# Patient Record
Sex: Male | Born: 1945 | Race: White | Hispanic: No | Marital: Married | Smoking: Former smoker
Health system: Southern US, Community
[De-identification: ages and names within clinical notes are randomized; demographics above are authoritative.]

## PROBLEM LIST (undated history)

## (undated) DIAGNOSIS — G43109 Migraine with aura, not intractable, without status migrainosus: Secondary | ICD-10-CM

## (undated) DIAGNOSIS — K219 Gastro-esophageal reflux disease without esophagitis: Secondary | ICD-10-CM

## (undated) DIAGNOSIS — J302 Other seasonal allergic rhinitis: Secondary | ICD-10-CM

## (undated) DIAGNOSIS — E785 Hyperlipidemia, unspecified: Secondary | ICD-10-CM

## (undated) DIAGNOSIS — C61 Malignant neoplasm of prostate: Secondary | ICD-10-CM

## (undated) DIAGNOSIS — K829 Disease of gallbladder, unspecified: Secondary | ICD-10-CM

## (undated) DIAGNOSIS — I2510 Atherosclerotic heart disease of native coronary artery without angina pectoris: Secondary | ICD-10-CM

## (undated) HISTORY — DX: Migraine with aura, not intractable, without status migrainosus: G43.109

## (undated) HISTORY — DX: Disease of gallbladder, unspecified: K82.9

## (undated) HISTORY — DX: Malignant neoplasm of prostate: C61

## (undated) HISTORY — DX: Atherosclerotic heart disease of native coronary artery without angina pectoris: I25.10

## (undated) HISTORY — DX: Hyperlipidemia, unspecified: E78.5

## (undated) HISTORY — DX: Gastro-esophageal reflux disease without esophagitis: K21.9

## (undated) HISTORY — DX: Other seasonal allergic rhinitis: J30.2

## (undated) HISTORY — PX: CATARACT EXTRACTION, BILATERAL: SHX1313

---

## 2005-08-02 ENCOUNTER — Ambulatory Visit: Payer: Self-pay | Admitting: Gastroenterology

## 2005-08-16 ENCOUNTER — Encounter (INDEPENDENT_AMBULATORY_CARE_PROVIDER_SITE_OTHER): Payer: Self-pay | Admitting: Specialist

## 2005-08-16 ENCOUNTER — Ambulatory Visit: Payer: Self-pay | Admitting: Internal Medicine

## 2006-07-02 HISTORY — PX: LAPAROSCOPIC CHOLECYSTECTOMY: SUR755

## 2006-07-08 HISTORY — PX: CARDIOVASCULAR STRESS TEST: SHX262

## 2006-07-16 ENCOUNTER — Ambulatory Visit (HOSPITAL_COMMUNITY): Admission: RE | Admit: 2006-07-16 | Discharge: 2006-07-16 | Payer: Self-pay | Admitting: *Deleted

## 2006-07-16 HISTORY — PX: CARDIAC CATHETERIZATION: SHX172

## 2007-07-03 HISTORY — PX: PROSTATE BIOPSY: SHX241

## 2008-03-15 ENCOUNTER — Inpatient Hospital Stay (HOSPITAL_COMMUNITY): Admission: AD | Admit: 2008-03-15 | Discharge: 2008-03-16 | Payer: Self-pay | Admitting: General Surgery

## 2008-03-15 ENCOUNTER — Encounter (INDEPENDENT_AMBULATORY_CARE_PROVIDER_SITE_OTHER): Payer: Self-pay | Admitting: General Surgery

## 2008-08-18 ENCOUNTER — Encounter (INDEPENDENT_AMBULATORY_CARE_PROVIDER_SITE_OTHER): Payer: Self-pay | Admitting: *Deleted

## 2008-08-30 ENCOUNTER — Ambulatory Visit: Payer: Self-pay | Admitting: Internal Medicine

## 2008-09-14 ENCOUNTER — Ambulatory Visit: Payer: Self-pay | Admitting: Internal Medicine

## 2008-09-14 ENCOUNTER — Encounter: Payer: Self-pay | Admitting: Internal Medicine

## 2008-09-14 HISTORY — PX: COLONOSCOPY: SHX174

## 2008-09-17 ENCOUNTER — Encounter: Payer: Self-pay | Admitting: Internal Medicine

## 2008-09-20 LAB — HM COLONOSCOPY

## 2010-11-14 NOTE — Op Note (Signed)
NAMEMARVENS, HOLLARS               ACCOUNT NO.:  [id number]   MEDICAL RECORD NO.:  [id number]          PATIENT TYPE:  AMB   LOCATION:  DAY                          FACILITY:  [facility]   PHYSICIAN:  Juanetta Gosling, MDDATE OF BIRTH:  [birth year]   DATE OF PROCEDURE:  03/15/2008  DATE OF DISCHARGE:                               OPERATIVE REPORT   PREOPERATIVE DIAGNOSES:  Symptomatic cholelithiasis, hydropic  gallbladder.   POSTOPERATIVE DIAGNOSES:  Symptomatic cholelithiasis, hydropic  gallbladder, chronic cholecystitis.   PROCEDURE:  Laparoscopic cholecystectomy.   SURGEON:  Juanetta Gosling, MD   ASSISTANT:  Claud Kelp and Dominga Ferry.   ANESTHESIA:  General.   FINDINGS:  Hydropic gallbladder that was adherent to the liver and signs  of chronic inflammation.   SPECIMEN:  Gallbladder and contents to pathology.   ESTIMATED BLOOD LOSS:  100 mL.   COMPLICATIONS:  None.   DRAINS:  None.   INDICATIONS:  Mr. Leverich is a 65 year old male who was seen after  feeling a possible right upper quadrant mass that he reported causing no  pain.  He does have some indigestion, he has been evaluated by [facility] for stress test with angina.  He was sent for a CT scan by  his primary care physician, noted the gallbladder being marked with  multiple gallstones but no biliary obstruction noted.  I sent him for an  ERCP as well which had excellent vision of the ductal system with no  evidence of pancreatic or liver mass, just with a very distended  gallbladder and gallstones.  I counseled him for a laparoscopic  cholecystectomy for what I think is likely a symptomatic cholelithiasis  and not his cardiac disease that he had originally been evaluated for.   PROCEDURE:  After informed consent was obtained, the patient was  administered cefoxitin.  He was then taken to the operating room where  he was placed under general anesthesia without complication.  Sequential  compression devices were placed on his lower extremity throughout the  operation.  His abdomen was then prepped and draped in standard sterile  surgical fashion and the surgical time-out was then performed.  A 10-mm  incision was then made below his belly button. Dissection was carried  out down to level of fascia which was entered sharply.  Peritoneum was  then entered bluntly. Vicryl pursestring suture was then placed on the  fascia.  Hassan trocar was then inserted and abdomen was insufflated to  15 mmHg pressure.  The camera was then inserted.  There was no injury  from entry a further 5-mm epigastric port was placed as well as two  further right upper quadrant ports.  They were all 5 mm in size after  infiltration with local anesthesia without complication.  The  gallbladder was noted to be very distended and nearly reached the  abdominal wall upon insufflating the abdomen.  There were many adhesions  to the surrounding structures and omentum was draped over the  gallbladder.  This was begun to be removed.  The needle was then  inserted and the gallbladder was  drained.  There was a large amount of  white bile that was removed from the gallbladder and it was made flaccid  and able to grasp at this point.  The adhesions were then taken down  from the gallbladder until we were approaching the infundibulum.  This  was finally retracted laterally and the rest the gallbladder was  retracted cephalad and dissection was carried out down the triangle of  Calot.  The cystic duct and cystic artery were both identified clearly  entering the gallbladder and the critical view of safety was obtained.  Once this view had been obtained, the cystic duct was clipped leaving  two clips in position, then cut and the cystic artery was then clipped  leaving two clips in position and cut as well.  Electrocautery was then  used to remove the gallbladder from the liver bed.  There were numerous  places where  there was no plane, the gallbladder was fused to the liver  bed.  A number of areas were bleeding during this portion of the  operation.  These were cauterized with the Bovie at 60 with cessation of  hemorrhage.  Gallbladder with some difficulty was eventually removed  from the liver bed.  This area was then inspected.  Suction was then  performed.  Hemostasis was eventually observed.  I did place a piece of  Surgicel in the liver bed at that point as well.  The gallbladder was  then placed in an EndoCatch bag.  A 5-mm camera was inserted to the  epigastrium.  The gallbladder was then removed from the umbilical port.  The Hasson trocar was then reinserted.  A 10-mm camera was inserted.  The liver bed was then viewed again and noted to be hemostatic.  Suction  was used to remove all the excess fluid that had there present and all  trocars removed under direct vision.  Hassan trocar was then removed.  The Vicryl was tied down.  There was still a small hernia defect there.  This was closed with an additional Vicryl suture.  All the skin sites  were closed with a 4-0 Monocryl in subcuticular fashion.  Dermabond was  placed over these.  He tolerated the procedure well and was extubated in  the operating room and transferred to recovery in stable position.      Juanetta Gosling, MD  Electronically Signed     MCW/MEDQ  D:  03/15/2008  T:  03/16/2008  Job:  [id number]

## 2010-11-17 NOTE — Cardiovascular Report (Signed)
NAMESHOURYA, MACPHERSON               ACCOUNT NO.:  [id number]   MEDICAL RECORD NO.:  [id number]          PATIENT TYPE:  OIB   LOCATION:  2899                         FACILITY:  [facility]   PHYSICIAN:  Elmore Guise., M.D.DATE OF BIRTH:  April 22, 1946   DATE OF PROCEDURE:  07/16/2006  DATE OF DISCHARGE:  07/16/2006                            CARDIAC CATHETERIZATION   INDICATIONS FOR PROCEDURE:  Chest pain with abnormal stress test.   HISTORY OF PRESENT ILLNESS:  Mr. Masoud is a very pleasant 65 year old  white male, past medical history of dyslipidemia, borderline  hypertension, who initially presented for evaluation of an abnormal EKG.  He underwent exercise stress testing which showed a small reversible  inferolateral defect.  He now presents for a cardiac catheterization.   PROCEDURE:  The patient was brought to the cardiac cath lab after  appropriate informed consent.  He was prepped and draped in sterile  fashion.  Approximately 15 mL of 1% lidocaine was used for local  anesthesia.  A 6-French sheath was placed in the right femoral artery  without difficulty.  Coronary angiography, LV angiography and limited  right femoral angiography were then performed.  Angio-Seal closure  device was deployed without difficulty.   FINDINGS:  1. Left main, no obstructive disease.  2. LAD:  Moderate-to-large vessel with mild luminal irregularities.  3. D1:  Moderate-sized vessel with mild luminal regularities.  4. D2:  Small vessel with mild luminal irregularities.  5. LCX:  Nondominant with mild luminal irregularities.  6. OM-1:  Small vessel with mild to moderate luminal irregularities.  7. OM-2:  Moderate-to-large vessel with two distal branches, all with      mild luminal irregularities.  8. RCA:  Dominant large vessel with mild luminal irregularities.  9. PDA/PLV:  No significant obstructive disease.  10.EF is 50-55%, no wall motion abnormalities.  LVEDP is 15 mmHg.  11.Limited right  femoral angiogram shows no significant obstructive      disease.  Arteriotomy site is satisfactory for Angio-Seal closure      device.  Angio-Seal closure device was deployed without difficulty.   IMPRESSION:  1. Non-obstructive coronary arteries as described.  2. Preserved LV systolic function with an EF of 50-55% with no wall      motion abnormalities.   PLAN:  1. At this time, I would recommend aggressive risk factor modification      as indicated.      Elmore Guise., M.D.  Electronically Signed     TWK/MEDQ  D:  07/16/2006  T:  07/16/2006  Job:  [id number]   cc:   Liston Alba, M.D.

## 2011-01-09 ENCOUNTER — Other Ambulatory Visit: Payer: Self-pay | Admitting: *Deleted

## 2011-01-09 DIAGNOSIS — E785 Hyperlipidemia, unspecified: Secondary | ICD-10-CM

## 2011-01-23 ENCOUNTER — Encounter: Payer: Self-pay | Admitting: Cardiovascular Disease

## 2011-01-23 ENCOUNTER — Other Ambulatory Visit: Payer: Self-pay | Admitting: Cardiovascular Disease

## 2011-01-23 NOTE — Telephone Encounter (Signed)
Fax received from pharmacy. Refill completed. Has app  Alfonso Ramus RN

## 2011-01-25 ENCOUNTER — Other Ambulatory Visit (INDEPENDENT_AMBULATORY_CARE_PROVIDER_SITE_OTHER): Payer: Medicare Other | Admitting: *Deleted

## 2011-01-25 ENCOUNTER — Encounter: Payer: Self-pay | Admitting: Cardiovascular Disease

## 2011-01-25 ENCOUNTER — Ambulatory Visit (INDEPENDENT_AMBULATORY_CARE_PROVIDER_SITE_OTHER): Payer: Medicare Other | Admitting: Cardiovascular Disease

## 2011-01-25 ENCOUNTER — Other Ambulatory Visit: Payer: Self-pay | Admitting: *Deleted

## 2011-01-25 VITALS — BP 130/84 | HR 66 | Ht 71.0 in | Wt 205.0 lb

## 2011-01-25 DIAGNOSIS — E785 Hyperlipidemia, unspecified: Secondary | ICD-10-CM | POA: Insufficient documentation

## 2011-01-25 DIAGNOSIS — I2510 Atherosclerotic heart disease of native coronary artery without angina pectoris: Secondary | ICD-10-CM | POA: Insufficient documentation

## 2011-01-25 DIAGNOSIS — R079 Chest pain, unspecified: Secondary | ICD-10-CM

## 2011-01-25 LAB — LIPID PANEL
LDL Cholesterol: 84 mg/dL (ref 0–99)
VLDL: 22.6 mg/dL (ref 0.0–40.0)

## 2011-01-25 LAB — HEPATIC FUNCTION PANEL
AST: 15 U/L (ref 0–37)
Alkaline Phosphatase: 76 U/L (ref 39–117)
Total Bilirubin: 0.6 mg/dL (ref 0.3–1.2)

## 2011-01-25 LAB — BASIC METABOLIC PANEL
GFR: 84.46 mL/min (ref >60.00)
Glucose, Bld: 98 mg/dL (ref 70–99)
Potassium: 4.5 mEq/L (ref 3.5–5.1)
Sodium: 139 mEq/L (ref 135–145)

## 2011-01-25 MED ORDER — ROSUVASTATIN CALCIUM 20 MG PO TABS: 20.0000 mg | ORAL_TABLET | Freq: Every day | ORAL | Status: DC

## 2011-01-25 NOTE — Assessment & Plan Note (Signed)
No further episodes of chest pain. 

## 2011-01-25 NOTE — Telephone Encounter (Signed)
Had med refill error and i reordered under correct [facility] mail order

## 2011-01-25 NOTE — Assessment & Plan Note (Signed)
We will continue with his same medications. Recheck lipids again in one year when I see him for his office visit.

## 2011-01-25 NOTE — Progress Notes (Signed)
Bernard Reed Date of Birth  08-26-1945 [facility] [address]     [address] [place], [place]  [address] [phone]  Fax  [phone]  History of Present Illness:  Hx of hyperlipidemia.  Non obstructive CAD by cath.1/08.  Occasional episodes of CP.  His overall done very well since he was last seen in July of last year. He has not had any episodes of chest pain or shortness of breath.  Current Outpatient Prescriptions on File Prior to Visit  Medication Sig Dispense Refill  . aspirin 81 MG tablet Take 81 mg by mouth daily.        . CRESTOR 20 MG tablet TAKE 1 TABLET BY MOUTH AT BEDTIME  90 tablet  0  . finasteride (PROSCAR) 5 MG tablet Take 5 mg by mouth daily.        . nitroGLYCERIN (NITROSTAT) 0.4 MG SL tablet Place 0.4 mg under the tongue every 5 (five) minutes as needed.          No Known Allergies  Past Medical History  Diagnosis Date  . Hyperlipidemia   . Prostate cancer   . Coronary artery disease     NONOBSTRUCTIVE  . GERD (gastroesophageal reflux disease)   . Gallbladder disease     Past Surgical History  Procedure Date  . Cardiac catheterization 07/16/2006    EF 50-55%  . Cardiovascular stress test 07/08/2006    EF 57%    History  Smoking status  . Former Smoker  . Quit date: 01/22/1981  Smokeless tobacco  . Not on file    History  Alcohol Use No    Family History  Problem Relation Age of Onset  . Heart disease Mother   . Heart disease Father   . Hypertension Father   . Heart attack Father     Reviw of Systems:  Reviewed in the HPI.  All other systems are negative.  Physical Exam: BP 130/84  Pulse 66  Ht 5\' 11"  (1.803 m)  Wt 205 lb (92.987 kg)  BMI 28.59 kg/m2 The patient is alert and oriented x 3.  The mood and affect are normal.   Skin: warm and dry.  Color is normal.    HEENT:   the sclera are nonicteric.  The mucous membranes are moist.  The carotids are 2+ without bruits.  There is no  thyromegaly.  There is no JVD.    Lungs: clear.  The chest wall is non tender.    Heart: regular rate with a normal S1 and S2.  There are no murmurs, gallops, or rubs. The PMI is not displaced.     Abdomen: good bowel sounds.  There is no guarding or rebound.  There is no hepatosplenomegaly or tenderness.  There are no masses.   Extremities:  no clubbing, cyanosis, or edema.  The legs are without rashes.  The distal pulses are intact.   Neuro:  Cranial nerves II - XII are intact.  Motor and sensory functions are intact.    The gait is normal.  ECG: Normal sinus rhythm. EKG is normal.  Assessment / Plan:

## 2011-01-26 ENCOUNTER — Telehealth: Payer: Self-pay | Admitting: *Deleted

## 2011-01-26 ENCOUNTER — Encounter: Payer: Self-pay | Admitting: Cardiovascular Disease

## 2011-01-26 NOTE — Telephone Encounter (Signed)
Patient called with lab results. Pt verbalized understanding. Jodette Cyann Venti RN  

## 2011-04-02 LAB — COMPREHENSIVE METABOLIC PANEL
Alkaline Phosphatase: 73
BUN: 7
Chloride: 102
Creatinine, Ser: 0.94
Glucose, Bld: 113 — ABNORMAL HIGH
Potassium: 4.3
Total Bilirubin: 1
Total Protein: 5.7 — ABNORMAL LOW

## 2011-04-02 LAB — CBC
HCT: 36.1 — ABNORMAL LOW
Hemoglobin: 12.4 — ABNORMAL LOW
Hemoglobin: 12.8 — ABNORMAL LOW
MCV: 86.9
Platelets: 255
RBC: 4.33
RDW: 14.6
WBC: 7.4

## 2011-04-04 LAB — COMPREHENSIVE METABOLIC PANEL
AST: 17
BUN: 11
CO2: 29
Chloride: 108
Creatinine, Ser: 0.86
GFR calc non Af Amer: >60
Glucose, Bld: 94
Total Bilirubin: 0.7

## 2011-04-04 LAB — DIFFERENTIAL
Basophils Absolute: 0
Eosinophils Relative: 3
Lymphocytes Relative: 29
Neutro Abs: 2.9
Neutrophils Relative %: 59

## 2011-04-04 LAB — CBC
HCT: 40
HCT: 41.7
MCHC: 33.9
MCV: 87.3
Platelets: 279
RBC: 4.58
RBC: 4.72
WBC: 4.9
WBC: 8.6

## 2011-07-05 ENCOUNTER — Other Ambulatory Visit: Payer: Self-pay | Admitting: Dermatology

## 2011-08-01 ENCOUNTER — Encounter: Payer: Self-pay | Admitting: Internal Medicine

## 2011-08-02 ENCOUNTER — Encounter: Payer: Self-pay | Admitting: Internal Medicine

## 2011-08-17 ENCOUNTER — Encounter: Payer: Self-pay | Admitting: Internal Medicine

## 2011-09-26 ENCOUNTER — Ambulatory Visit (AMBULATORY_SURGERY_CENTER): Payer: Medicare Other | Admitting: *Deleted

## 2011-09-26 ENCOUNTER — Encounter: Payer: Self-pay | Admitting: Internal Medicine

## 2011-09-26 VITALS — Ht 71.0 in | Wt 209.8 lb

## 2011-09-26 DIAGNOSIS — Z860100 Personal history of colon polyps, unspecified: Secondary | ICD-10-CM

## 2011-09-26 DIAGNOSIS — Z1211 Encounter for screening for malignant neoplasm of colon: Secondary | ICD-10-CM

## 2011-09-26 MED ORDER — PEG-KCL-NACL-NASULF-NA ASC-C 100 G PO SOLR: ORAL | Status: DC

## 2011-10-10 ENCOUNTER — Encounter: Payer: Self-pay | Admitting: Internal Medicine

## 2011-10-10 ENCOUNTER — Ambulatory Visit (AMBULATORY_SURGERY_CENTER): Payer: Medicare Other | Admitting: Internal Medicine

## 2011-10-10 VITALS — BP 131/75 | HR 77 | Temp 96.2°F | Resp 15 | Ht 71.0 in | Wt 209.0 lb

## 2011-10-10 DIAGNOSIS — Z860100 Personal history of colon polyps, unspecified: Secondary | ICD-10-CM

## 2011-10-10 DIAGNOSIS — D126 Benign neoplasm of colon, unspecified: Secondary | ICD-10-CM

## 2011-10-10 DIAGNOSIS — D129 Benign neoplasm of anus and anal canal: Secondary | ICD-10-CM

## 2011-10-10 DIAGNOSIS — Z8711 Personal history of peptic ulcer disease: Secondary | ICD-10-CM

## 2011-10-10 DIAGNOSIS — Z1211 Encounter for screening for malignant neoplasm of colon: Secondary | ICD-10-CM

## 2011-10-10 DIAGNOSIS — D128 Benign neoplasm of rectum: Secondary | ICD-10-CM

## 2011-10-10 MED ORDER — SODIUM CHLORIDE 0.9 % IV SOLN: 500.0000 mL | INTRAVENOUS | Status: DC

## 2011-10-10 NOTE — Progress Notes (Signed)
Patient did not experience any of the following events: a burn prior to discharge; a fall within the facility; wrong site/side/patient/procedure/implant event; or a hospital transfer or hospital admission upon discharge from the facility. (G8907) Patient did not have preoperative order for IV antibiotic SSI prophylaxis. (G8918)  

## 2011-10-10 NOTE — Progress Notes (Signed)
Pressure applied to abdomen to reach cecum.  

## 2011-10-10 NOTE — Patient Instructions (Signed)
YOU HAD AN ENDOSCOPIC PROCEDURE TODAY AT THE [facility]: Refer to the procedure report that was given to you for any specific questions about what was found during the examination.  If the procedure report does not answer your questions, please call your gastroenterologist to clarify.  If you requested that your care partner not be given the details of your procedure findings, then the procedure report has been included in a sealed envelope for you to review at your convenience later.  YOU SHOULD EXPECT: Some feelings of bloating in the abdomen. Passage of more gas than usual.  Walking can help get rid of the air that was put into your GI tract during the procedure and reduce the bloating. If you had a lower endoscopy (such as a colonoscopy or flexible sigmoidoscopy) you may notice spotting of blood in your stool or on the toilet paper. If you underwent a bowel prep for your procedure, then you may not have a normal bowel movement for a few days.  DIET: Your first meal following the procedure should be a light meal and then it is ok to progress to your normal diet.  A half-sandwich or bowl of soup is an example of a good first meal.  Heavy or fried foods are harder to digest and may make you feel nauseous or bloated.  Likewise meals heavy in dairy and vegetables can cause extra gas to form and this can also increase the bloating.  Drink plenty of fluids but you should avoid alcoholic beverages for 24 hours.  ACTIVITY: Your care partner should take you home directly after the procedure.  You should plan to take it easy, moving slowly for the rest of the day.  You can resume normal activity the day after the procedure however you should NOT DRIVE or use heavy machinery for 24 hours (because of the sedation medicines used during the test).    SYMPTOMS TO REPORT IMMEDIATELY: A gastroenterologist can be reached at any hour.  During normal business hours, 8:30 AM to 5:00 PM Monday through Friday,  call [phone].  After hours and on weekends, please call the GI answering service at [phone] who will take a message and have the physician on call contact you.   Following lower endoscopy (colonoscopy or flexible sigmoidoscopy):  Excessive amounts of blood in the stool  Significant tenderness or worsening of abdominal pains  Swelling of the abdomen that is new, acute  Fever of 100F or higher  Following upper endoscopy (EGD)  Vomiting of blood or coffee ground material  New chest pain or pain under the shoulder blades  Painful or persistently difficult swallowing  New shortness of breath  Fever of 100F or higher  Black, tarry-looking stools  FOLLOW UP: If any biopsies were taken you will be contacted by phone or by letter within the next 1-3 weeks.  Call your gastroenterologist if you have not heard about the biopsies in 3 weeks.  Our staff will call the home number listed on your records the next business day following your procedure to check on you and address any questions or concerns that you may have at that time regarding the information given to you following your procedure. This is a courtesy call and so if there is no answer at the home number and we have not heard from you through the emergency physician on call, we will assume that you have returned to your regular daily activities without incident.  SIGNATURES/CONFIDENTIALITY: You and/or your care   partner have signed paperwork which will be entered into your electronic medical record.  These signatures attest to the fact that that the information above on your After Visit Summary has been reviewed and is understood.  Full responsibility of the confidentiality of this discharge information lies with you and/or your care-partner.  

## 2011-10-10 NOTE — Op Note (Signed)
[facility] [address] [place], [place]  [address]  COLONOSCOPY PROCEDURE REPORT  PATIENT:  Bernard, Reed  MR#:  [id number] BIRTHDATE:  06-16-1946, 66 yrs. old  GENDER:  male ENDOSCOPIST:  Wilhemina Bonito. Eda Keys, MD REF. BY:  Surveillance Program Recall, PROCEDURE DATE:  10/10/2011 PROCEDURE:  Colonoscopy with snare polypectomy x 2 ASA CLASS:  Class II INDICATIONS:  history of pre-cancerous (adenomatous) colon polyps, surveillance and high-risk screening ; index 2007 and f/u 2010 with multiple adenomas MEDICATIONS:   MAC sedation, administered by CRNA, propofol (Diprivan) 400 mg IV  DESCRIPTION OF PROCEDURE:   After the risks benefits and alternatives of the procedure were thoroughly explained, informed consent was obtained.  Digital rectal exam was performed and revealed no abnormalities.   The LB CF-[id number] endoscope was introduced through the anus and advanced to the cecum, which was identified by both the appendix and ileocecal valve, without limitations.  The quality of the prep was excellent, using MoviPrep.  The instrument was then slowly withdrawn as the colon was fully examined. <<PROCEDUREIMAGES>>  FINDINGS:  REDUNDAT COLON. Two 4mm polyps were found in the ascending and transversecolon. Polyps were snared without cautery. Retrieval was successful. Otherwise normal colonoscopy without other polyps, masses, vascular ectasias, or inflammatory changes. Retroflexed views in the rectum revealed no abnormalities.    The time to cecum = 11:12  minutes. The scope was then withdrawn in 15:34  minutes from the cecum and the procedure completed.  COMPLICATIONS:  None  ENDOSCOPIC IMPRESSION: 1) Two polyps in the  colon - removed 2) Otherwise normal colonoscopy  RECOMMENDATIONS: 1) Follow up colonoscopy in 5 years  ______________________________ Wilhemina Bonito. Eda Keys, MD  CC:  Benedetto Goad, MD;  The Patient  n. eSIGNED:   Wilhemina Bonito. Eda Keys at 10/10/2011 10:01  AM  [place], Maurine Minister, [phone]

## 2011-10-11 ENCOUNTER — Telehealth: Payer: Self-pay | Admitting: *Deleted

## 2011-10-11 NOTE — Telephone Encounter (Signed)
  Follow up Call-  Call back number 10/10/2011  Post procedure Call Back phone  # [phone]  Permission to leave phone message Yes     Patient questions:  Do you have a fever, pain , or abdominal swelling? no Pain Score  0 *  Have you tolerated food without any problems? yes  Have you been able to return to your normal activities? yes  Do you have any questions about your discharge instructions: Diet   no Medications  no Follow up visit  no  Do you have questions or concerns about your Care? no  Actions: * If pain score is 4 or above: No action needed, pain <4.

## 2011-10-16 ENCOUNTER — Encounter: Payer: Self-pay | Admitting: Internal Medicine

## 2012-04-10 ENCOUNTER — Telehealth: Payer: Self-pay | Admitting: Cardiovascular Disease

## 2012-04-10 DIAGNOSIS — E785 Hyperlipidemia, unspecified: Secondary | ICD-10-CM

## 2012-04-10 NOTE — Telephone Encounter (Signed)
Pt needs refill of crestor, [facility] [phone]

## 2012-04-11 MED ORDER — ROSUVASTATIN CALCIUM 20 MG PO TABS: 20.0000 mg | ORAL_TABLET | Freq: Every day | ORAL | Status: DC

## 2012-04-11 NOTE — Telephone Encounter (Signed)
Pt needs appointment then refill can be made 

## 2012-06-27 HISTORY — PX: PROSTATE BIOPSY: SHX241

## 2012-09-16 ENCOUNTER — Other Ambulatory Visit: Payer: Self-pay | Admitting: Dermatology

## 2012-10-20 ENCOUNTER — Telehealth: Payer: Self-pay | Admitting: *Deleted

## 2012-10-20 NOTE — Telephone Encounter (Signed)
lm for pt to call office to make appointment and leave a name of a local pharmacy for his crestor to be be filled. number provided. pt last seen 2012.

## 2012-10-24 ENCOUNTER — Telehealth: Payer: Self-pay | Admitting: *Deleted

## 2012-10-24 NOTE — Telephone Encounter (Signed)
lm for pt to call office to make appointment. pt has not been seen since 2012. need pt to leave a local pharmacy information for office to send medication to until office visit.

## 2013-06-08 HISTORY — PX: PROSTATE BIOPSY: SHX241

## 2014-02-24 ENCOUNTER — Telehealth: Payer: Self-pay | Admitting: Internal Medicine

## 2014-02-24 NOTE — Telephone Encounter (Signed)
Dr. Redmond School will take pt on as a new pt. Pt will be coming in September 16th for a flu shot but has not scheduled anything else

## 2014-03-17 ENCOUNTER — Other Ambulatory Visit: Payer: Self-pay

## 2014-03-23 ENCOUNTER — Encounter: Payer: Self-pay | Admitting: Family Medicine

## 2014-03-23 ENCOUNTER — Other Ambulatory Visit: Payer: Self-pay | Admitting: Family Medicine

## 2014-03-23 ENCOUNTER — Ambulatory Visit (INDEPENDENT_AMBULATORY_CARE_PROVIDER_SITE_OTHER): Payer: Medicare Other | Admitting: Family Medicine

## 2014-03-23 ENCOUNTER — Telehealth: Payer: Self-pay | Admitting: Internal Medicine

## 2014-03-23 VITALS — BP 140/90 | HR 78 | Ht 69.5 in | Wt 214.0 lb

## 2014-03-23 DIAGNOSIS — I2510 Atherosclerotic heart disease of native coronary artery without angina pectoris: Secondary | ICD-10-CM

## 2014-03-23 DIAGNOSIS — E785 Hyperlipidemia, unspecified: Secondary | ICD-10-CM

## 2014-03-23 DIAGNOSIS — Z23 Encounter for immunization: Secondary | ICD-10-CM

## 2014-03-23 DIAGNOSIS — C61 Malignant neoplasm of prostate: Secondary | ICD-10-CM

## 2014-03-23 DIAGNOSIS — Z86010 Personal history of colonic polyps: Secondary | ICD-10-CM

## 2014-03-23 DIAGNOSIS — I25119 Atherosclerotic heart disease of native coronary artery with unspecified angina pectoris: Secondary | ICD-10-CM

## 2014-03-23 DIAGNOSIS — I209 Angina pectoris, unspecified: Secondary | ICD-10-CM

## 2014-03-23 LAB — CBC WITH DIFFERENTIAL/PLATELET
BASOS ABS: 0 10*3/uL (ref 0.0–0.1)
Basophils Relative: 1 % (ref 0–1)
EOS ABS: 0 10*3/uL (ref 0.0–0.7)
EOS PCT: 1 % (ref 0–5)
HCT: 40.2 % (ref 39.0–52.0)
Hemoglobin: 14.3 g/dL (ref 13.0–17.0)
LYMPHS ABS: 1.3 10*3/uL (ref 0.7–4.0)
Lymphocytes Relative: 28 % (ref 12–46)
MCH: 29.7 pg (ref 26.0–34.0)
MCHC: 35.6 g/dL (ref 30.0–36.0)
MCV: 83.6 fL (ref 78.0–100.0)
Monocytes Absolute: 0.3 10*3/uL (ref 0.1–1.0)
Monocytes Relative: 7 % (ref 3–12)
NEUTROS PCT: 63 % (ref 43–77)
Neutro Abs: 2.9 10*3/uL (ref 1.7–7.7)
PLATELETS: 319 10*3/uL (ref 150–400)
RBC: 4.81 MIL/uL (ref 4.22–5.81)
RDW: 14.2 % (ref 11.5–15.5)
WBC: 4.6 10*3/uL (ref 4.0–10.5)

## 2014-03-23 NOTE — Progress Notes (Signed)
   Subjective:    Patient ID: Bernard Reed, male    DOB: Jul 06, 1945, 68 y.o.   MRN: [id number]  HPI He is here for an initial visit to establish care. He has a history of prostate cancer and apparently is being followed for this. He is not on any medications at the present time. He also is a English as a second language teacher and does go to the [place] but would prefer to get his care here. He has had colonoscopy and review did show adenomatous polyps. He also has a history of nonobstructive cardiac disease and apparently did have angina symptoms over the last trouble he had was over a year ago. He is on Crestor. He is now retired.   Review of Systems     Objective:   Physical Exam Alert and in no distress otherwise not examined       Assessment & Plan:  Coronary artery disease involving native coronary artery of native heart with angina pectoris - Plan: CBC with Differential, Comprehensive metabolic panel, Lipid panel  Other and unspecified hyperlipidemia - Plan: Lipid panel  Prostate cancer - Plan: CBC with Differential, Comprehensive metabolic panel  Need for prophylactic vaccination and inoculation against influenza - Plan: Flu vaccine HIGH DOSE PF (Fluzone Tri High dose)  Need for prophylactic vaccination against Streptococcus pneumoniae (pneumococcus) - Plan: Pneumococcal conjugate vaccine 13-valent  Hx of adenomatous colonic polyps  He will return here for complete examination in the near future. His immunizations will be updated. I did give him a prescription for Zostavax.

## 2014-03-23 NOTE — Telephone Encounter (Signed)
Received medical records from Dr. Kathryne Eriksson

## 2014-03-24 LAB — LIPID PANEL
CHOL/HDL RATIO: 4.7 ratio
Cholesterol: 228 mg/dL — ABNORMAL HIGH (ref 0–200)
HDL: 49 mg/dL (ref >39)
LDL CALC: 138 mg/dL — AB (ref 0–99)
Triglycerides: 205 mg/dL — ABNORMAL HIGH (ref <150)
VLDL: 41 mg/dL — AB (ref 0–40)

## 2014-03-24 LAB — COMPREHENSIVE METABOLIC PANEL
ALK PHOS: 66 U/L (ref 39–117)
ALT: 18 U/L (ref 0–53)
AST: 14 U/L (ref 0–37)
Albumin: 4.5 g/dL (ref 3.5–5.2)
BILIRUBIN TOTAL: 0.5 mg/dL (ref 0.2–1.2)
BUN: 14 mg/dL (ref 6–23)
CO2: 25 meq/L (ref 19–32)
CREATININE: 0.94 mg/dL (ref 0.50–1.35)
Calcium: 9.8 mg/dL (ref 8.4–10.5)
Chloride: 105 mEq/L (ref 96–112)
Glucose, Bld: 90 mg/dL (ref 70–99)
Potassium: 4 mEq/L (ref 3.5–5.3)
SODIUM: 141 meq/L (ref 135–145)
TOTAL PROTEIN: 6.9 g/dL (ref 6.0–8.3)

## 2014-03-25 LAB — PSA: PSA: 4.1 ng/mL — AB (ref <=4.00)

## 2014-03-29 ENCOUNTER — Other Ambulatory Visit: Payer: Self-pay

## 2014-03-29 MED ORDER — ROSUVASTATIN CALCIUM 10 MG PO TABS
10.0000 mg | ORAL_TABLET | Freq: Every day | ORAL | Status: DC
Start: 2014-03-29 — End: 2015-05-10

## 2014-04-05 ENCOUNTER — Telehealth: Payer: Self-pay | Admitting: Family Medicine

## 2014-04-05 NOTE — Telephone Encounter (Signed)
Records received from pt previous doc. Sending records back please note what needs to scanned or abstracted.

## 2014-04-06 ENCOUNTER — Encounter: Payer: Self-pay | Admitting: Internal Medicine

## 2014-11-01 ENCOUNTER — Encounter: Payer: Self-pay | Admitting: Internal Medicine

## 2015-01-06 ENCOUNTER — Other Ambulatory Visit: Payer: Self-pay | Admitting: Family Medicine

## 2015-01-07 ENCOUNTER — Other Ambulatory Visit: Payer: Self-pay | Admitting: Family Medicine

## 2015-01-07 NOTE — Telephone Encounter (Signed)
I have refused this med because pt hasn't had med consistently needs appointment

## 2015-04-08 ENCOUNTER — Other Ambulatory Visit (INDEPENDENT_AMBULATORY_CARE_PROVIDER_SITE_OTHER): Payer: Medicare Other

## 2015-04-08 DIAGNOSIS — Z23 Encounter for immunization: Secondary | ICD-10-CM

## 2015-05-10 ENCOUNTER — Ambulatory Visit (INDEPENDENT_AMBULATORY_CARE_PROVIDER_SITE_OTHER): Payer: Medicare Other | Admitting: Family Medicine

## 2015-05-10 ENCOUNTER — Encounter: Payer: Self-pay | Admitting: Family Medicine

## 2015-05-10 VITALS — BP 106/68 | HR 60 | Resp 12 | Ht 70.0 in | Wt 209.8 lb

## 2015-05-10 DIAGNOSIS — Z86010 Personal history of colonic polyps: Secondary | ICD-10-CM

## 2015-05-10 DIAGNOSIS — C61 Malignant neoplasm of prostate: Secondary | ICD-10-CM | POA: Diagnosis not present

## 2015-05-10 DIAGNOSIS — H269 Unspecified cataract: Secondary | ICD-10-CM | POA: Diagnosis not present

## 2015-05-10 DIAGNOSIS — Z136 Encounter for screening for cardiovascular disorders: Secondary | ICD-10-CM

## 2015-05-10 DIAGNOSIS — I491 Atrial premature depolarization: Secondary | ICD-10-CM

## 2015-05-10 DIAGNOSIS — E785 Hyperlipidemia, unspecified: Secondary | ICD-10-CM | POA: Diagnosis not present

## 2015-05-10 DIAGNOSIS — Z23 Encounter for immunization: Secondary | ICD-10-CM | POA: Diagnosis not present

## 2015-05-10 DIAGNOSIS — I2510 Atherosclerotic heart disease of native coronary artery without angina pectoris: Secondary | ICD-10-CM

## 2015-05-10 LAB — COMPREHENSIVE METABOLIC PANEL
ALBUMIN: 4.4 g/dL (ref 3.6–5.1)
ALT: 14 U/L (ref 9–46)
AST: 13 U/L (ref 10–35)
Alkaline Phosphatase: 76 U/L (ref 40–115)
BUN: 13 mg/dL (ref 7–25)
CALCIUM: 9.8 mg/dL (ref 8.6–10.3)
CHLORIDE: 104 mmol/L (ref 98–110)
CO2: 23 mmol/L (ref 20–31)
Creat: 0.97 mg/dL (ref 0.70–1.25)
Glucose, Bld: 90 mg/dL (ref 65–99)
Potassium: 4.3 mmol/L (ref 3.5–5.3)
SODIUM: 137 mmol/L (ref 135–146)
Total Bilirubin: 0.6 mg/dL (ref 0.2–1.2)
Total Protein: 7 g/dL (ref 6.1–8.1)

## 2015-05-10 LAB — CBC WITH DIFFERENTIAL/PLATELET
BASOS ABS: 0 10*3/uL (ref 0.0–0.1)
BASOS PCT: 0 % (ref 0–1)
EOS ABS: 0 10*3/uL (ref 0.0–0.7)
Eosinophils Relative: 1 % (ref 0–5)
HCT: 43.3 % (ref 39.0–52.0)
Hemoglobin: 14.7 g/dL (ref 13.0–17.0)
Lymphocytes Relative: 26 % (ref 12–46)
Lymphs Abs: 1.2 10*3/uL (ref 0.7–4.0)
MCH: 29.5 pg (ref 26.0–34.0)
MCHC: 33.9 g/dL (ref 30.0–36.0)
MCV: 86.9 fL (ref 78.0–100.0)
MONOS PCT: 7 % (ref 3–12)
MPV: 9.7 fL (ref 8.6–12.4)
Monocytes Absolute: 0.3 10*3/uL (ref 0.1–1.0)
NEUTROS PCT: 66 % (ref 43–77)
Neutro Abs: 3 10*3/uL (ref 1.7–7.7)
PLATELETS: 351 10*3/uL (ref 150–400)
RBC: 4.98 MIL/uL (ref 4.22–5.81)
RDW: 13.7 % (ref 11.5–15.5)
WBC: 4.6 10*3/uL (ref 4.0–10.5)

## 2015-05-10 MED ORDER — ROSUVASTATIN CALCIUM 10 MG PO TABS: 10.0000 mg | ORAL_TABLET | Freq: Every day | ORAL | Status: DC

## 2015-05-10 MED ORDER — NITROGLYCERIN 0.4 MG SL SUBL: 0.4000 mg | SUBLINGUAL_TABLET | SUBLINGUAL | Status: DC | PRN

## 2015-05-10 NOTE — Progress Notes (Signed)
   Subjective:    Patient ID: Bernard Reed, male    DOB: 30-Aug-1945, 69 y.o.   MRN: [id number]  HPI He is here for an interval evaluation. He does have a history of coronary artery disease and has had a cath done several years ago which did show some small vessel disease. He's had been placed on nitroglycerin however he states he has not had to take nitroglycerin at least several months. He continues on a baby aspirin every day. Specifically he has not had any chest pain, PND, DOE. He has occasionally had some nighttime discomfort not sure if this is cardiac versus GI related. He is supposed be on Crestor however he ran out of the medication. He does have a history of prostate cancer and presently isn't involved in active surveillance. He is being followed by [facility] area he also has a history of colonic polyps and is on a 5 year schedule. He was seen recently by his eye doctor and does have a right cataract which is again being followed but no intervention at this point as needed. No other concerns or complaints. His health maintenance, family and social history as well as immunizations were reviewed. In the past he had been followed by the [place] however he is not happy with the care and is now under my care.   Review of Systems  All other systems reviewed and are negative.      Objective:   Physical Exam Alert and in no distress. Tympanic membranes and canals are normal. Funduscopic exam difficult to do due to  Cataract.Pharyngeal area is normal. Neck is supple without adenopathy or thyromegaly. Cardiac exam shows a regular sinus rhythm without murmurs or gallops. Lungs are clear to auscultation. Abdominal exam shows decreased bowel sounds without masses or tenderness. EKG does show PACs       Assessment & Plan:  Need for prophylactic vaccination against Streptococcus pneumoniae (pneumococcus) - Plan: Pneumococcal polysaccharide vaccine 23-valent greater than or equal to 69yo  subcutaneous/IM  Screening for AAA (abdominal aortic aneurysm) - Plan: US Aorta Initial Medicare Screen  Hyperlipidemia - Plan: Lipid panel, rosuvastatin (CRESTOR) 10 MG tablet  Coronary artery disease involving native coronary artery of native heart without angina pectoris - Plan: CBC with Differential/Platelet, Comprehensive metabolic panel, Lipid panel, nitroGLYCERIN (NITROSTAT) 0.4 MG SL tablet, EKG 12-Lead  Prostate cancer (HCC) - Plan: CBC with Differential/Platelet, Comprehensive metabolic panel  History of colonic polyps - Plan: CBC with Differential/Platelet, Comprehensive metabolic panel  Cataract  Premature atrial contractions  I will place him back on Crestor and have him return here in 2 months for follow-up. He will also be sent off for ultrasound as he does have remote history of smoking. He will follow-up with urology and with GI as per previously discussed. He will also follow-up with his eye doctor concerning the cataract. A prescription was written for him to get Zostavax and TDaP. Over 45 minutes, greater than 50% spent in counseling and coordination of care.

## 2015-05-16 ENCOUNTER — Ambulatory Visit (HOSPITAL_COMMUNITY)
Admission: RE | Admit: 2015-05-16 | Discharge: 2015-05-16 | Disposition: A | Discharge status: Home / Self Care | Payer: Medicare Other | Source: Ambulatory Visit | Attending: Family Medicine | Admitting: Family Medicine

## 2015-05-16 DIAGNOSIS — Z136 Encounter for screening for cardiovascular disorders: Secondary | ICD-10-CM

## 2015-05-16 DIAGNOSIS — Z87891 Personal history of nicotine dependence: Secondary | ICD-10-CM | POA: Diagnosis not present

## 2015-07-11 ENCOUNTER — Ambulatory Visit (INDEPENDENT_AMBULATORY_CARE_PROVIDER_SITE_OTHER): Payer: PPO | Admitting: Family Medicine

## 2015-07-11 ENCOUNTER — Telehealth: Payer: Self-pay | Admitting: Internal Medicine

## 2015-07-11 ENCOUNTER — Encounter: Payer: Self-pay | Admitting: Family Medicine

## 2015-07-11 VITALS — BP 126/76 | HR 62 | Resp 12 | Wt 213.2 lb

## 2015-07-11 DIAGNOSIS — E785 Hyperlipidemia, unspecified: Secondary | ICD-10-CM | POA: Diagnosis not present

## 2015-07-11 MED ORDER — ROSUVASTATIN CALCIUM 10 MG PO TABS: 10.0000 mg | ORAL_TABLET | Freq: Every day | ORAL | Status: DC

## 2015-07-11 MED ORDER — FINASTERIDE 5 MG PO TABS: 5.0000 mg | ORAL_TABLET | Freq: Every day | ORAL | Status: DC

## 2015-07-11 NOTE — Telephone Encounter (Signed)
Pharmacy called asking for a refill request for finaseteride 5mg  #90 to [facility] [address]. Dr. Crawford Givens was the last to fill it but its suppose to go here instead. Is this okay to refill?

## 2015-07-11 NOTE — Addendum Note (Signed)
Addended by: Denita Lung on: 07/11/2015 03:33 PM   Modules accepted: Orders

## 2015-07-11 NOTE — Progress Notes (Signed)
   Subjective:    Patient ID: Bernard Reed, male    DOB: Oct 12, 1945, 70 y.o.   MRN: [id number]  HPI He is here for recheck. He ran out of Crestor 3 weeks ago but apparently did not bother to call or reschedule.   Review of Systems     Objective:   Physical Exam Alert and in no distress otherwise not examined       Assessment & Plan:  Hyperlipidemia - Plan: rosuvastatin (CRESTOR) 10 MG tablet  I gave him a 90 day supply and he is to return here in 2 months.

## 2015-09-08 ENCOUNTER — Ambulatory Visit (INDEPENDENT_AMBULATORY_CARE_PROVIDER_SITE_OTHER): Payer: PPO | Admitting: Family Medicine

## 2015-09-08 VITALS — BP 122/80 | HR 64 | Wt 211.0 lb

## 2015-09-08 DIAGNOSIS — E785 Hyperlipidemia, unspecified: Secondary | ICD-10-CM

## 2015-09-08 DIAGNOSIS — C61 Malignant neoplasm of prostate: Secondary | ICD-10-CM | POA: Diagnosis not present

## 2015-09-08 LAB — LIPID PANEL
CHOL/HDL RATIO: 3.7 ratio (ref <=5.0)
CHOLESTEROL: 142 mg/dL (ref 125–200)
HDL: 38 mg/dL — AB (ref >=40)
LDL CALC: 72 mg/dL (ref <130)
TRIGLYCERIDES: 158 mg/dL — AB (ref <150)
VLDL: 32 mg/dL — AB (ref <30)

## 2015-09-08 NOTE — Progress Notes (Signed)
   Subjective:    Patient ID: Bernard Reed, male    DOB: [birth year], 70 y.o.   MRN: [id number]  HPI He is here for cholesterol check. On his last visit he had not taken his medication in several weeks but now over the last 2 months has been taking this regularly and has no particular concerns or complaints.   Review of Systems     Objective:   Physical Exam And in no distress otherwise not examined       Assessment & Plan:  Hyperlipidemia - Plan: Lipid panel

## 2015-09-11 MED ORDER — ROSUVASTATIN CALCIUM 10 MG PO TABS: 10.0000 mg | ORAL_TABLET | Freq: Every day | ORAL | Status: DC

## 2015-09-11 NOTE — Addendum Note (Signed)
Addended by: Denita Lung on: 09/11/2015 08:57 PM   Modules accepted: Orders

## 2015-09-15 DIAGNOSIS — C61 Malignant neoplasm of prostate: Secondary | ICD-10-CM | POA: Diagnosis not present

## 2015-12-06 ENCOUNTER — Other Ambulatory Visit: Payer: Self-pay | Admitting: Dermatology

## 2015-12-06 DIAGNOSIS — L723 Sebaceous cyst: Secondary | ICD-10-CM | POA: Diagnosis not present

## 2015-12-06 DIAGNOSIS — D239 Other benign neoplasm of skin, unspecified: Secondary | ICD-10-CM | POA: Diagnosis not present

## 2015-12-06 DIAGNOSIS — L820 Inflamed seborrheic keratosis: Secondary | ICD-10-CM | POA: Diagnosis not present

## 2015-12-06 DIAGNOSIS — D485 Neoplasm of uncertain behavior of skin: Secondary | ICD-10-CM | POA: Diagnosis not present

## 2016-03-12 DIAGNOSIS — C61 Malignant neoplasm of prostate: Secondary | ICD-10-CM | POA: Diagnosis not present

## 2016-03-19 DIAGNOSIS — R972 Elevated prostate specific antigen [PSA]: Secondary | ICD-10-CM | POA: Diagnosis not present

## 2016-03-19 HISTORY — PX: PROSTATE BIOPSY: SHX241

## 2016-07-11 ENCOUNTER — Other Ambulatory Visit: Payer: Self-pay | Admitting: Family Medicine

## 2016-08-28 ENCOUNTER — Encounter: Payer: Self-pay | Admitting: Internal Medicine

## 2016-09-04 ENCOUNTER — Encounter: Payer: Self-pay | Admitting: Internal Medicine

## 2016-09-10 DIAGNOSIS — C61 Malignant neoplasm of prostate: Secondary | ICD-10-CM | POA: Diagnosis not present

## 2016-09-10 DIAGNOSIS — N5201 Erectile dysfunction due to arterial insufficiency: Secondary | ICD-10-CM | POA: Diagnosis not present

## 2016-10-01 DIAGNOSIS — H5213 Myopia, bilateral: Secondary | ICD-10-CM | POA: Diagnosis not present

## 2016-10-01 DIAGNOSIS — H524 Presbyopia: Secondary | ICD-10-CM | POA: Diagnosis not present

## 2016-10-01 DIAGNOSIS — H52223 Regular astigmatism, bilateral: Secondary | ICD-10-CM | POA: Diagnosis not present

## 2016-10-01 DIAGNOSIS — H2513 Age-related nuclear cataract, bilateral: Secondary | ICD-10-CM | POA: Diagnosis not present

## 2016-10-13 ENCOUNTER — Other Ambulatory Visit: Payer: Self-pay | Admitting: Family Medicine

## 2016-10-13 DIAGNOSIS — E785 Hyperlipidemia, unspecified: Secondary | ICD-10-CM

## 2016-10-14 ENCOUNTER — Other Ambulatory Visit: Payer: Self-pay | Admitting: Family Medicine

## 2016-11-02 ENCOUNTER — Ambulatory Visit: Payer: PPO | Admitting: *Deleted

## 2016-11-02 ENCOUNTER — Encounter: Payer: Self-pay | Admitting: Internal Medicine

## 2016-11-02 VITALS — Ht 68.5 in | Wt 205.8 lb

## 2016-11-02 DIAGNOSIS — Z86010 Personal history of colonic polyps: Secondary | ICD-10-CM

## 2016-11-02 MED ORDER — NA SULFATE-K SULFATE-MG SULF 17.5-3.13-1.6 GM/177ML PO SOLN: 1.0000 | Freq: Once | ORAL | 0 refills | Status: AC

## 2016-11-02 NOTE — Progress Notes (Signed)
Denies allergies to eggs or soy products. Denies complications with sedation or anesthesia. Denies O2 use. Denies use of diet or weight loss medications.  Emmi instructions given for colonoscopy.  

## 2016-11-15 ENCOUNTER — Encounter: Payer: Self-pay | Admitting: Internal Medicine

## 2016-11-15 ENCOUNTER — Other Ambulatory Visit: Payer: Self-pay | Admitting: Family Medicine

## 2016-11-15 ENCOUNTER — Ambulatory Visit (AMBULATORY_SURGERY_CENTER): Payer: PPO | Admitting: Internal Medicine

## 2016-11-15 VITALS — BP 146/67 | HR 57 | Temp 97.3°F | Resp 18 | Ht 68.0 in | Wt 205.0 lb

## 2016-11-15 DIAGNOSIS — D122 Benign neoplasm of ascending colon: Secondary | ICD-10-CM

## 2016-11-15 DIAGNOSIS — D123 Benign neoplasm of transverse colon: Secondary | ICD-10-CM | POA: Diagnosis not present

## 2016-11-15 DIAGNOSIS — D125 Benign neoplasm of sigmoid colon: Secondary | ICD-10-CM | POA: Diagnosis not present

## 2016-11-15 DIAGNOSIS — I2510 Atherosclerotic heart disease of native coronary artery without angina pectoris: Secondary | ICD-10-CM | POA: Diagnosis not present

## 2016-11-15 DIAGNOSIS — Z86010 Personal history of colonic polyps: Secondary | ICD-10-CM

## 2016-11-15 MED ORDER — SODIUM CHLORIDE 0.9 % IV SOLN: 500.0000 mL | INTRAVENOUS | Status: DC

## 2016-11-15 NOTE — Progress Notes (Signed)
Pt's states no medical or surgical changes since previsit or office visit. 

## 2016-11-15 NOTE — Patient Instructions (Signed)
Discharge instructions given. Handouts on polyps and diverticulosis. Resume previous medications. YOU HAD AN ENDOSCOPIC PROCEDURE TODAY AT THE [facility]:   Refer to the procedure report that was given to you for any specific questions about what was found during the examination.  If the procedure report does not answer your questions, please call your gastroenterologist to clarify.  If you requested that your care partner not be given the details of your procedure findings, then the procedure report has been included in a sealed envelope for you to review at your convenience later.  YOU SHOULD EXPECT: Some feelings of bloating in the abdomen. Passage of more gas than usual.  Walking can help get rid of the air that was put into your GI tract during the procedure and reduce the bloating. If you had a lower endoscopy (such as a colonoscopy or flexible sigmoidoscopy) you may notice spotting of blood in your stool or on the toilet paper. If you underwent a bowel prep for your procedure, you may not have a normal bowel movement for a few days.  Please Note:  You might notice some irritation and congestion in your nose or some drainage.  This is from the oxygen used during your procedure.  There is no need for concern and it should clear up in a day or so.  SYMPTOMS TO REPORT IMMEDIATELY:   Following lower endoscopy (colonoscopy or flexible sigmoidoscopy):  Excessive amounts of blood in the stool  Significant tenderness or worsening of abdominal pains  Swelling of the abdomen that is new, acute  Fever of 100F or higher   For urgent or emergent issues, a gastroenterologist can be reached at any hour by calling [phone].   DIET:  We do recommend a small meal at first, but then you may proceed to your regular diet.  Drink plenty of fluids but you should avoid alcoholic beverages for 24 hours.  ACTIVITY:  You should plan to take it easy for the rest of today and you should NOT  DRIVE or use heavy machinery until tomorrow (because of the sedation medicines used during the test).    FOLLOW UP: Our staff will call the number listed on your records the next business day following your procedure to check on you and address any questions or concerns that you may have regarding the information given to you following your procedure. If we do not reach you, we will leave a message.  However, if you are feeling well and you are not experiencing any problems, there is no need to return our call.  We will assume that you have returned to your regular daily activities without incident.  If any biopsies were taken you will be contacted by phone or by letter within the next 1-3 weeks.  Please call us at [phone] if you have not heard about the biopsies in 3 weeks.    SIGNATURES/CONFIDENTIALITY: You and/or your care partner have signed paperwork which will be entered into your electronic medical record.  These signatures attest to the fact that that the information above on your After Visit Summary has been reviewed and is understood.  Full responsibility of the confidentiality of this discharge information lies with you and/or your care-partner. 

## 2016-11-15 NOTE — Progress Notes (Signed)
To Pacu-Pt awake and alert  Report to RN 

## 2016-11-15 NOTE — Progress Notes (Signed)
Called to room to assist during endoscopic procedure.  Patient ID and intended procedure confirmed with present staff. Received instructions for my participation in the procedure from the performing physician.  

## 2016-11-15 NOTE — Op Note (Signed)
[facility] Patient Name: Bernard Reed Procedure Date: 11/15/2016 11:53 AM MRN: [id number] Endoscopist: Docia Chuck. Henrene Pastor , MD Age: 71 Referring MD:  Date of Birth: 30-Nov-1945 Gender: Male Account #: [id number] Procedure:                Colonoscopy, with cold snare polypectomy X3 Indications:              High risk colon cancer surveillance: Personal                            history of multiple (3 or more) adenomas. Previous                            examinations 2007, 2010, 2013 Medicines:                Monitored Anesthesia Care Procedure:                Pre-Anesthesia Assessment:                           - Prior to the procedure, a History and Physical                            was performed, and patient medications and                            allergies were reviewed. The patient's tolerance of                            previous anesthesia was also reviewed. The risks                            and benefits of the procedure and the sedation                            options and risks were discussed with the patient.                            All questions were answered, and informed consent                            was obtained. Prior Anticoagulants: The patient has                            taken no previous anticoagulant or antiplatelet                            agents. ASA Grade Assessment: II - A patient with                            mild systemic disease. After reviewing the risks                            and benefits, the patient was deemed in  satisfactory condition to undergo the procedure.                           After obtaining informed consent, the colonoscope                            was passed under direct vision. Throughout the                            procedure, the patient's blood pressure, pulse, and                            oxygen saturations were monitored continuously. The   Colonoscope was introduced through the anus and                            advanced to the the cecum, identified by                            appendiceal orifice and ileocecal valve. The                            ileocecal valve, appendiceal orifice, and rectum                            were photographed. The quality of the bowel                            preparation was excellent. The colonoscopy was                            performed without difficulty. The patient tolerated                            the procedure well. The bowel preparation used was                            SUPREP. Scope In: 11:58:49 AM Scope Out: 12:21:32 PM Scope Withdrawal Time: 0 hours 13 minutes 36 seconds  Total Procedure Duration: 0 hours 22 minutes 43 seconds  Findings:                 The colon was elongated.Three polyps were found in                            the sigmoid colon, transverse colon and ascending                            colon. The polyps were 2 to 4 mm in size. These                            polyps were removed with a cold snare. Resection                            and retrieval  were complete.                           Multiple diverticula were found in the sigmoid                            colon.                           The exam was otherwise without abnormality on                            direct and retroflexion views. Complications:            No immediate complications. Estimated blood loss:                            None. Estimated Blood Loss:     Estimated blood loss: none. Impression:               - Three 2 to 4 mm polyps in the sigmoid colon, in                            the transverse colon and in the ascending colon,                            removed with a cold snare. Resected and retrieved.                           - Diverticulosis in the sigmoid colon.                           - The examination was otherwise normal on direct                            and  retroflexion views. Recommendation:           - Repeat colonoscopy in 5 years for surveillance.                           - Patient has a contact number available for                            emergencies. The signs and symptoms of potential                            delayed complications were discussed with the                            patient. Return to normal activities tomorrow.                            Written discharge instructions were provided to the                            patient.                           -  Resume previous diet.                           - Continue present medications.                           - Await pathology results. Docia Chuck. Henrene Pastor, MD 11/15/2016 12:26:21 PM This report has been signed electronically.

## 2016-11-16 ENCOUNTER — Telehealth: Payer: Self-pay | Admitting: *Deleted

## 2016-11-16 NOTE — Telephone Encounter (Signed)
  Follow up Call-  Call back number 11/15/2016  Post procedure Call Back phone  # [phone]  Permission to leave phone message Yes  Some recent data might be hidden     Patient questions:  Do you have a fever, pain , or abdominal swelling? No. Pain Score  0 *  Have you tolerated food without any problems? Yes.    Have you been able to return to your normal activities? Yes.    Do you have any questions about your discharge instructions: Diet   No. Medications  No. Follow up visit  No.  Do you have questions or concerns about your Care? No.  Actions: * If pain score is 4 or above: No action needed, pain <4.

## 2016-11-20 ENCOUNTER — Encounter: Payer: Self-pay | Admitting: Internal Medicine

## 2016-12-05 NOTE — Telephone Encounter (Signed)
This encounter was created in error - please disregard.

## 2016-12-21 ENCOUNTER — Ambulatory Visit (INDEPENDENT_AMBULATORY_CARE_PROVIDER_SITE_OTHER): Payer: PPO | Admitting: Family Medicine

## 2016-12-21 ENCOUNTER — Encounter: Payer: Self-pay | Admitting: Family Medicine

## 2016-12-21 VITALS — BP 126/80 | HR 65 | Resp 18 | Ht 69.0 in | Wt 203.8 lb

## 2016-12-21 DIAGNOSIS — Z0000 Encounter for general adult medical examination without abnormal findings: Secondary | ICD-10-CM | POA: Diagnosis not present

## 2016-12-21 DIAGNOSIS — Z1159 Encounter for screening for other viral diseases: Secondary | ICD-10-CM | POA: Diagnosis not present

## 2016-12-21 DIAGNOSIS — Z86010 Personal history of colonic polyps: Secondary | ICD-10-CM

## 2016-12-21 DIAGNOSIS — I491 Atrial premature depolarization: Secondary | ICD-10-CM

## 2016-12-21 DIAGNOSIS — C61 Malignant neoplasm of prostate: Secondary | ICD-10-CM | POA: Diagnosis not present

## 2016-12-21 DIAGNOSIS — I2510 Atherosclerotic heart disease of native coronary artery without angina pectoris: Secondary | ICD-10-CM | POA: Diagnosis not present

## 2016-12-21 DIAGNOSIS — E785 Hyperlipidemia, unspecified: Secondary | ICD-10-CM

## 2016-12-21 DIAGNOSIS — H269 Unspecified cataract: Secondary | ICD-10-CM

## 2016-12-21 LAB — CBC WITH DIFFERENTIAL/PLATELET
BASOS ABS: 56 {cells}/uL (ref 0–200)
Basophils Relative: 1 %
EOS ABS: 168 {cells}/uL (ref 15–500)
Eosinophils Relative: 3 %
HCT: 43.6 % (ref 38.5–50.0)
Hemoglobin: 14.5 g/dL (ref 13.2–17.1)
LYMPHS PCT: 24 %
Lymphs Abs: 1344 cells/uL (ref 850–3900)
MCH: 29.7 pg (ref 27.0–33.0)
MCHC: 33.3 g/dL (ref 32.0–36.0)
MCV: 89.2 fL (ref 80.0–100.0)
MONOS PCT: 8 %
MPV: 9.9 fL (ref 7.5–12.5)
Monocytes Absolute: 448 cells/uL (ref 200–950)
Neutro Abs: 3584 cells/uL (ref 1500–7800)
Neutrophils Relative %: 64 %
PLATELETS: 335 10*3/uL (ref 140–400)
RBC: 4.89 MIL/uL (ref 4.20–5.80)
RDW: 13.9 % (ref 11.0–15.0)
WBC: 5.6 10*3/uL (ref 4.0–10.5)

## 2016-12-21 LAB — COMPREHENSIVE METABOLIC PANEL
ALK PHOS: 77 U/L (ref 40–115)
ALT: 14 U/L (ref 9–46)
AST: 13 U/L (ref 10–35)
Albumin: 4.4 g/dL (ref 3.6–5.1)
BUN: 12 mg/dL (ref 7–25)
CALCIUM: 9.1 mg/dL (ref 8.6–10.3)
CHLORIDE: 105 mmol/L (ref 98–110)
CO2: 23 mmol/L (ref 20–31)
Creat: 1.04 mg/dL (ref 0.70–1.18)
GLUCOSE: 90 mg/dL (ref 65–99)
POTASSIUM: 4.2 mmol/L (ref 3.5–5.3)
Sodium: 141 mmol/L (ref 135–146)
TOTAL PROTEIN: 7 g/dL (ref 6.1–8.1)
Total Bilirubin: 0.5 mg/dL (ref 0.2–1.2)

## 2016-12-21 LAB — LIPID PANEL
Cholesterol: 149 mg/dL (ref <200)
HDL: 53 mg/dL (ref >40)
LDL Cholesterol: 77 mg/dL (ref <100)
TRIGLYCERIDES: 97 mg/dL (ref <150)
Total CHOL/HDL Ratio: 2.8 Ratio (ref <5.0)
VLDL: 19 mg/dL (ref <30)

## 2016-12-21 MED ORDER — FINASTERIDE 5 MG PO TABS: 5.0000 mg | ORAL_TABLET | Freq: Every day | ORAL | 3 refills | Status: DC

## 2016-12-21 MED ORDER — ROSUVASTATIN CALCIUM 10 MG PO TABS: 10.0000 mg | ORAL_TABLET | Freq: Every day | ORAL | 3 refills | Status: DC

## 2016-12-21 NOTE — Progress Notes (Signed)
Bernard Reed is a 71 y.o. male who presents for annual wellness visit and follow-up on chronic medical conditions.  He has the following concerns: He offered no particular concerns or complaints to me. He is presently in active surveillance for his prostate cancer. His Gleason score was 6. His most recent PSA was 4.47. He does have underlying erectile dysfunction but is not interested in having that treated. He does continue on finasteride. He also is taking Crestor for his hyperlipidemia and having no aches and pains with that. He has underlying coronary artery disease but has not taken any nitroglycerin in quite some time. He does have a history of PACs. No chest pain, shortness breath, PND or DOE. He is retired and keeping himself quite busy. His marriage is going quite well.  Immunizations and Health Maintenance Immunization History  Administered Date(s) Administered  . Influenza, High Dose Seasonal PF 03/23/2014, 04/08/2015  . Pneumococcal Conjugate-13 03/23/2014  . Pneumococcal Polysaccharide-23 05/10/2015   Health Maintenance Due  Topic Date Due  . Hepatitis C Screening  September 06, 1945  . TETANUS/TDAP  08/25/1964    Last colonoscopy: 10/2016 Last PSA: 03/2014 Dentist: Dr. Quillian Quince Last EKG 05/2015 Ophtho: Dr. Sabra Heck Exercise: does not exercise.   Other doctors caring for patient include: Urology- [facility]  Advanced Directives:  Living will- yes, POA-no    Depression screen:  See questionnaire below.     Depression screen [facility] 2/9 12/21/2016 03/23/2014  Decreased Interest 0 0  Down, Depressed, Hopeless 0 0  PHQ - 2 Score 0 0    Fall Screen: See Questionaire below.   Fall Risk  12/21/2016 03/23/2014  Falls in the past year? No No    ADL screen:  See questionnaire below.  Functional Status Survey: Is the patient deaf or have difficulty hearing?: No Does the patient have difficulty seeing, even when wearing glasses/contacts?: No Does the patient have difficulty  concentrating, remembering, or making decisions?: No Does the patient have difficulty walking or climbing stairs?: No Does the patient have difficulty dressing or bathing?: No Does the patient have difficulty doing errands alone such as visiting a doctor's office or shopping?: No   Review of Systems Negative except as above   PHYSICAL EXAM:  General Appearance: Alert, cooperative, no distress, appears stated age Head: Normocephalic, without obvious abnormality, atraumatic Eyes: PERRL, conjunctiva/corneas clear, EOM's intact, fundi benign Ears: Normal TM's and external ear canals Nose: Nares normal, mucosa normal, no drainage or sinus   tenderness Throat: Lips, mucosa, and tongue normal; teeth and gums normal Neck: Supple, no lymphadenopathy, thyroid:no enlargement/tenderness/nodules; no carotid bruit or JVD Lungs: Clear to auscultation bilaterally without wheezes, rales or ronchi; respirations unlabored Heart: Regular rate and rhythm, S1 and S2 normal, no murmur, rub or gallop Abdomen: Soft, non-tender, nondistended, normoactive bowel sounds, no masses, no hepatosplenomegaly Extremities: No clubbing, cyanosis or edema Pulses: 2+ and symmetric all extremities Skin: Skin color, texture, turgor normal, no rashes or lesions Lymph nodes: Cervical, supraclavicular, and axillary nodes normal Neurologic: CNII-XII intact, normal strength, sensation and gait; reflexes 2+ and symmetric throughout   Psych: Normal mood, affect, hygiene and grooming  ASSESSMENT/PLAN: Routine general medical examination at a health care facility - Plan: CBC with Differential/Platelet, Comprehensive metabolic panel, Lipid panel  Coronary artery disease involving native coronary artery of native heart without angina pectoris - Plan: CBC with Differential/Platelet, Comprehensive metabolic panel, Lipid panel  Prostate cancer ([place]) - Plan: finasteride (PROSCAR) 5 MG tablet  Cataract, unspecified cataract type,  unspecified laterality  History  of colonic polyps  Hyperlipidemia, unspecified hyperlipidemia type - Plan: Lipid panel, rosuvastatin (CRESTOR) 10 MG tablet  Premature atrial contractions - Plan: CBC with Differential/Platelet, Comprehensive metabolic panel, Lipid panel  Need for hepatitis C screening test - Plan: Hepatitis C antibody He seems a doing quite well at present medication regimen for the above diagnoses. recommended at least 30 minutes of aerobic activity at least 5 days/week; ; healthy diet and alcohol recommendations (less than or equal to 2 drinks/day) reviewed; regular seatbelt use; changing batteries in smoke detectors. Immunization recommendations discussed.   Medicare Attestation I have personally reviewed: The patient's medical and social history Their use of alcohol, tobacco or illicit drugs Their current medications and supplements The patient's functional ability including ADLs,fall risks, home safety risks, cognitive, and hearing and visual impairment Diet and physical activities Evidence for depression or mood disorders  The patient's weight, height, and BMI have been recorded in the chart.  I have made referrals, counseling, and provided education to the patient based on review of the above and I have provided the patient with a written personalized care plan for preventive services.     Wyatt Haste, MD   12/21/2016

## 2016-12-21 NOTE — Patient Instructions (Signed)
  Mr. Day , Thank you for taking time to come for your Medicare Wellness Visit. I appreciate your ongoing commitment to your health goals. Please review the following plan we discussed and let me know if I can assist you in the future.   These are the goals we discussed: Goals    None      This is a list of the screening recommended for you and due dates:  Health Maintenance  Topic Date Due  .  Hepatitis C: One time screening is recommended by Center for Disease Control  (CDC) for  adults born from [birth year] through 1965.   1946/01/23  . Tetanus Vaccine  08/25/1964  . Flu Shot  01/30/2017  . Colon Cancer Screening  11/15/2021  . Pneumonia vaccines  Completed

## 2016-12-22 LAB — HEPATITIS C ANTIBODY: HCV AB: NEGATIVE

## 2017-03-12 DIAGNOSIS — C61 Malignant neoplasm of prostate: Secondary | ICD-10-CM | POA: Diagnosis not present

## 2017-03-19 DIAGNOSIS — N401 Enlarged prostate with lower urinary tract symptoms: Secondary | ICD-10-CM | POA: Diagnosis not present

## 2017-03-19 DIAGNOSIS — N138 Other obstructive and reflux uropathy: Secondary | ICD-10-CM | POA: Diagnosis not present

## 2017-03-19 DIAGNOSIS — C61 Malignant neoplasm of prostate: Secondary | ICD-10-CM | POA: Diagnosis not present

## 2017-08-08 ENCOUNTER — Ambulatory Visit: Payer: PPO | Admitting: Family Medicine

## 2017-08-12 ENCOUNTER — Ambulatory Visit (INDEPENDENT_AMBULATORY_CARE_PROVIDER_SITE_OTHER): Payer: PPO | Admitting: Family Medicine

## 2017-08-12 ENCOUNTER — Encounter: Payer: Self-pay | Admitting: Family Medicine

## 2017-08-12 VITALS — BP 136/76 | HR 65 | Wt 204.4 lb

## 2017-08-12 DIAGNOSIS — H6123 Impacted cerumen, bilateral: Secondary | ICD-10-CM | POA: Diagnosis not present

## 2017-08-12 DIAGNOSIS — B351 Tinea unguium: Secondary | ICD-10-CM

## 2017-08-12 MED ORDER — TERBINAFINE HCL 250 MG PO TABS: 250.0000 mg | ORAL_TABLET | Freq: Every day | ORAL | 0 refills | Status: DC

## 2017-08-12 NOTE — Progress Notes (Signed)
   Subjective:    Patient ID: Bernard Reed, male    DOB: February 18, 1946, 72 y.o.   MRN: [id number]  HPI He has had difficulty hearing especially on the left side and feels as if he has water in his ears.  He also has a long history of difficulty with onychomycosis so far has opted to not treat this.  He is interested in potentially getting this treated.   Review of Systems     Objective:   Physical Exam Alert and in no distress.  Cerumen is noted in both ears, left greater than right.  Exam of the toes does show extensive thickening of the nails.       Assessment & Plan:  Onychomycosis - Plan: terbinafine (LAMISIL) 250 MG tablet  Bilateral impacted cerumen  The canals were lavaged with removal of most of the wax.  Slight erythema noted to the floor of the left canal. Discussed treatment of his onychomycosis.  Explained that it takes 6-12 months to cure, we need to be careful concerning liver problems and the fact that this can easily come back again.  He is aware of this and willing to try it.

## 2017-09-02 DIAGNOSIS — C61 Malignant neoplasm of prostate: Secondary | ICD-10-CM | POA: Diagnosis not present

## 2017-09-09 DIAGNOSIS — N5201 Erectile dysfunction due to arterial insufficiency: Secondary | ICD-10-CM | POA: Diagnosis not present

## 2017-09-09 DIAGNOSIS — C61 Malignant neoplasm of prostate: Secondary | ICD-10-CM | POA: Diagnosis not present

## 2017-11-04 DIAGNOSIS — H25813 Combined forms of age-related cataract, bilateral: Secondary | ICD-10-CM | POA: Diagnosis not present

## 2017-11-04 DIAGNOSIS — H25811 Combined forms of age-related cataract, right eye: Secondary | ICD-10-CM | POA: Diagnosis not present

## 2017-11-04 DIAGNOSIS — Z01818 Encounter for other preprocedural examination: Secondary | ICD-10-CM | POA: Diagnosis not present

## 2017-11-08 ENCOUNTER — Ambulatory Visit: Payer: PPO | Admitting: Family Medicine

## 2017-11-08 DIAGNOSIS — H5201 Hypermetropia, right eye: Secondary | ICD-10-CM | POA: Diagnosis not present

## 2017-11-08 DIAGNOSIS — H52221 Regular astigmatism, right eye: Secondary | ICD-10-CM | POA: Diagnosis not present

## 2017-11-08 DIAGNOSIS — H2511 Age-related nuclear cataract, right eye: Secondary | ICD-10-CM | POA: Diagnosis not present

## 2017-11-08 DIAGNOSIS — H25811 Combined forms of age-related cataract, right eye: Secondary | ICD-10-CM | POA: Diagnosis not present

## 2017-11-08 DIAGNOSIS — Z9841 Cataract extraction status, right eye: Secondary | ICD-10-CM | POA: Diagnosis not present

## 2017-11-08 DIAGNOSIS — Z961 Presence of intraocular lens: Secondary | ICD-10-CM | POA: Diagnosis not present

## 2017-11-16 ENCOUNTER — Other Ambulatory Visit: Payer: Self-pay | Admitting: Family Medicine

## 2017-11-16 DIAGNOSIS — B351 Tinea unguium: Secondary | ICD-10-CM

## 2017-11-18 NOTE — Telephone Encounter (Signed)
Pt has an appt 12-30-2017 do you want to see him before than. [place]

## 2017-11-18 NOTE — Telephone Encounter (Signed)
[facility] is requesting to fill lamisil. Pleas advise

## 2017-11-18 NOTE — Telephone Encounter (Signed)
Have him come in if possible so I can assess success and check his liver function.  I think he might be going on a vacation and if so I will give him enough to cover till he comes back.  Just let me know

## 2017-11-28 ENCOUNTER — Encounter: Payer: Self-pay | Admitting: Family Medicine

## 2017-12-17 ENCOUNTER — Encounter: Payer: Self-pay | Admitting: Family Medicine

## 2017-12-17 ENCOUNTER — Ambulatory Visit (INDEPENDENT_AMBULATORY_CARE_PROVIDER_SITE_OTHER): Payer: PPO | Admitting: Family Medicine

## 2017-12-17 VITALS — BP 122/76 | HR 68 | Temp 97.4°F | Ht 69.0 in | Wt 205.2 lb

## 2017-12-17 DIAGNOSIS — H9110 Presbycusis, unspecified ear: Secondary | ICD-10-CM

## 2017-12-17 DIAGNOSIS — B351 Tinea unguium: Secondary | ICD-10-CM | POA: Diagnosis not present

## 2017-12-17 DIAGNOSIS — B079 Viral wart, unspecified: Secondary | ICD-10-CM

## 2017-12-17 DIAGNOSIS — H269 Unspecified cataract: Secondary | ICD-10-CM | POA: Diagnosis not present

## 2017-12-17 DIAGNOSIS — Z79899 Other long term (current) drug therapy: Secondary | ICD-10-CM | POA: Diagnosis not present

## 2017-12-17 MED ORDER — TERBINAFINE HCL 250 MG PO TABS: 250.0000 mg | ORAL_TABLET | Freq: Every day | ORAL | 0 refills | Status: DC

## 2017-12-17 NOTE — Progress Notes (Signed)
   Subjective:    Patient ID: Bernard Reed, male    DOB: 1946-01-05, 72 y.o.   MRN: [id number]  HPI He is here for a recheck.  He has been taking the Lamisil and does note an improvement.  He has been on it for 3 months.  He also has a lesion on his left upper eyelid he would like evaluated.  He was recently evaluated for hearing loss at [facility] and apparently he does have a hearing loss but he did not get the hearing aids from them.  He also has cataract and does plan to follow-up with his ophthalmologist.   Review of Systems     Objective:   Physical Exam Alert and in no distress.  A 3 mm wart like lesion is noted on the left lateral upper eyelid.  Exam of his feet does show proximal clearing of the nails.       Assessment & Plan:  Onychomycosis - Plan: Comprehensive metabolic panel, terbinafine (LAMISIL) 250 MG tablet  Cataract, unspecified cataract type, unspecified laterality  Encounter for long-term (current) use of medications - Plan: Comprehensive metabolic panel  Presbycusis, unspecified laterality  Wart of face I will do blood work to make sure his liver function is okay and go ahead and renew the Lamisil.  He will follow-up with his ophthalmologist concerning the cataract and also mentioned to have the ophthalmologist remove the wart. I discussed the presbycusis with him.  Since he is not having much difficulty, further evaluation is not needed.  Did recommend seeing an audiologist if we do get to that point and he was comfortable with that.

## 2017-12-18 LAB — COMPREHENSIVE METABOLIC PANEL
ALBUMIN: 4.4 g/dL (ref 3.5–4.8)
ALK PHOS: 83 IU/L (ref 39–117)
ALT: 13 IU/L (ref 0–44)
AST: 12 IU/L (ref 0–40)
Albumin/Globulin Ratio: 2.1 (ref 1.2–2.2)
BUN / CREAT RATIO: 13 (ref 10–24)
BUN: 12 mg/dL (ref 8–27)
Bilirubin Total: 0.4 mg/dL (ref 0.0–1.2)
CALCIUM: 8.9 mg/dL (ref 8.6–10.2)
CO2: 22 mmol/L (ref 20–29)
CREATININE: 0.95 mg/dL (ref 0.76–1.27)
Chloride: 106 mmol/L (ref 96–106)
GFR calc Af Amer: 92 mL/min/{1.73_m2} (ref >59)
GFR, EST NON AFRICAN AMERICAN: 80 mL/min/{1.73_m2} (ref >59)
GLOBULIN, TOTAL: 2.1 g/dL (ref 1.5–4.5)
GLUCOSE: 105 mg/dL — AB (ref 65–99)
Potassium: 3.9 mmol/L (ref 3.5–5.2)
SODIUM: 142 mmol/L (ref 134–144)
TOTAL PROTEIN: 6.5 g/dL (ref 6.0–8.5)

## 2017-12-30 ENCOUNTER — Ambulatory Visit (INDEPENDENT_AMBULATORY_CARE_PROVIDER_SITE_OTHER): Payer: PPO | Admitting: Family Medicine

## 2017-12-30 ENCOUNTER — Encounter: Payer: Self-pay | Admitting: Family Medicine

## 2017-12-30 VITALS — BP 112/78 | HR 73 | Temp 97.9°F | Ht 69.75 in | Wt 201.6 lb

## 2017-12-30 DIAGNOSIS — E785 Hyperlipidemia, unspecified: Secondary | ICD-10-CM

## 2017-12-30 DIAGNOSIS — R7309 Other abnormal glucose: Secondary | ICD-10-CM

## 2017-12-30 DIAGNOSIS — I2510 Atherosclerotic heart disease of native coronary artery without angina pectoris: Secondary | ICD-10-CM | POA: Diagnosis not present

## 2017-12-30 DIAGNOSIS — I491 Atrial premature depolarization: Secondary | ICD-10-CM | POA: Diagnosis not present

## 2017-12-30 DIAGNOSIS — Z860100 Personal history of colon polyps, unspecified: Secondary | ICD-10-CM

## 2017-12-30 DIAGNOSIS — C61 Malignant neoplasm of prostate: Secondary | ICD-10-CM | POA: Diagnosis not present

## 2017-12-30 DIAGNOSIS — Z0000 Encounter for general adult medical examination without abnormal findings: Secondary | ICD-10-CM

## 2017-12-30 DIAGNOSIS — H269 Unspecified cataract: Secondary | ICD-10-CM

## 2017-12-30 LAB — POCT GLYCOSYLATED HEMOGLOBIN (HGB A1C): Hemoglobin A1C: 5.6 % (ref 4.0–5.6)

## 2017-12-30 MED ORDER — ROSUVASTATIN CALCIUM 10 MG PO TABS: 10.0000 mg | ORAL_TABLET | Freq: Every day | ORAL | 3 refills | Status: DC

## 2017-12-30 NOTE — Patient Instructions (Signed)
  Mr. Bernard Reed , Thank you for taking time to come for your Medicare Wellness Visit. I appreciate your ongoing commitment to your health goals. Please review the following plan we discussed and let me know if I can assist you in the future.   These are the goals we discussed: Goals    None      This is a list of the screening recommended for you and due dates:  Health Maintenance  Topic Date Due  . Tetanus Vaccine  08/25/1964  . Flu Shot  01/30/2018  . Colon Cancer Screening  11/15/2021  .  Hepatitis C: One time screening is recommended by Center for Disease Control  (CDC) for  adults born from [birth year] through 1965.   Completed  . Pneumonia vaccines  Completed

## 2017-12-30 NOTE — Progress Notes (Signed)
Bernard Reed is a 72 y.o. male who presents for annual wellness visit CPE and follow-up on chronic medical conditions.  He has the following concerns: He does complain of some tingling sensation to the lateral aspect of the right foot.  No history of injury.  No back pain, weakness.  He does complain of a long history of low back pain that usually occurring in the morning that does get better as the day goes on.  He does have cataracts and is followed regularly by ophthalmology.  He has a history of colonic polyps and is now under routine pattern with seeing gastroenterology.  He is followed by urology for prostate cancer.  His Gleason score was 6.  Continues on Lamisil for treatment of his onychomycosis and getting good results with that.  Continues on Crestor for treatment of hyperlipidemia.  Does have a history of nonobstructive coronary artery disease.  Presently he is having no chest pain, shortness of breath or PND.  He does not use nitroglycerin.  He does have a previous history of PACs.   Immunizations and Health Maintenance Immunization History  Administered Date(s) Administered  . Influenza, High Dose Seasonal PF 03/23/2014, 04/08/2015, 03/01/2017  . Pneumococcal Conjugate-13 03/23/2014  . Pneumococcal Polysaccharide-23 05/10/2015   Health Maintenance Due  Topic Date Due  . Samul Dada  08/25/1964    Last colonoscopy: 2018 Last PSA: this year Dentist:sept 2018 Ophtho: last few weeks  Exercise:  Yard work   Engineer, mining for patient include:[place] Advanced Directives: Yes.  Copy asked for. Does Patient Have a Medical Advance Directive?: Yes Does patient want to make changes to medical advance directive?: No - Patient declined  Depression screen:  See questionnaire below.     Depression screen [facility] 2/9 12/30/2017 12/21/2016 03/23/2014  Decreased Interest 0 0 0  Down, Depressed, Hopeless 0 0 0  PHQ - 2 Score 0 0 0    Fall Screen: See Questionaire  below.   Fall Risk  12/30/2017 12/21/2016 03/23/2014  Falls in the past year? No No No    ADL screen:  See questionnaire below.  Functional Status Survey: Is the patient deaf or have difficulty hearing?: No Does the patient have difficulty seeing, even when wearing glasses/contacts?: No Does the patient have difficulty concentrating, remembering, or making decisions?: No Does the patient have difficulty walking or climbing stairs?: No Does the patient have difficulty dressing or bathing?: No Does the patient have difficulty doing errands alone such as visiting a doctor's office or shopping?: No   Review of Systems  Constitutional: -, -unexpected weight change, -anorexia, -fatigue Allergy: -sneezing, -itching, -congestion Dermatology: denies changing moles, rash, lumps ENT: -runny nose, -ear pain, -sore throat,  Cardiology:  -chest pain, -palpitations, -orthopnea, Respiratory: -cough, -shortness of breath, -dyspnea on exertion, -wheezing,  Gastroenterology: -abdominal pain, -nausea, -vomiting, -diarrhea, -constipation, -dysphagia Hematology: -bleeding or bruising problems Musculoskeletal: -arthralgias, -myalgias, -joint swelling, -back pain, - Ophthalmology: -vision changes,  Urology: -dysuria, -difficulty urinating,  -urinary frequency, -urgency, incontinence Neurology: -, -numbness, , -memory loss, -falls, -dizziness    PHYSICAL EXAM:  BP 112/78 (BP Location: Left Arm, Patient Position: Sitting)   Pulse 73   Temp 97.9 F (36.6 C)   Ht 5' 9.75" (1.772 m)   Wt 201 lb 9.6 oz (91.4 kg)   SpO2 93%   BMI 29.13 kg/m   General Appearance: Alert, cooperative, no distress, appears stated age Head: Normocephalic, without obvious abnormality, atraumatic Eyes: PERRL, conjunctiva/corneas clear, EOM's intact, fundi benign Ears: Normal TM's and  external ear canals Nose: Nares normal, mucosa normal, no drainage or sinus   tenderness Throat: Lips, mucosa, and tongue normal; teeth and  gums normal Neck: Supple, no lymphadenopathy, thyroid:no enlargement/tenderness/nodules; no carotid bruit or JVD Lungs: Clear to auscultation bilaterally without wheezes, rales or ronchi; respirations unlabored Heart: Regular rate and rhythm, S1 and S2 normal, no murmur, rub or gallop Abdomen: Soft, non-tender, nondistended, normoactive bowel sounds, no masses, no hepatosplenomegaly Extremities: No clubbing, cyanosis or edema.  Toenails do show about 50% improvement  pulses: 2+ and symmetric all extremities Skin: Skin color, texture, turgor normal, no rashes or lesions Lymph nodes: Cervical, supraclavicular, and axillary nodes normal Neurologic: CNII-XII intact, normal strength, sensation and gait; reflexes 2+ and symmetric throughout   Psych: Normal mood, affect, hygiene and grooming  A1c was 5.6 ASSESSMENT/PLAN: Routine general medical examination at a health care facility - Plan: Lipid panel, CBC with Differential/Platelet  Cataract, unspecified cataract type, unspecified laterality  History of colonic polyps  Hyperlipidemia, unspecified hyperlipidemia type - Plan: Lipid panel  Premature atrial contractions  Prostate cancer ([place])  Coronary artery disease involving native coronary artery of native heart without angina pectoris - Plan: Lipid panel, CBC with Differential/Platelet  Elevated glucose - Plan: HgB A1c Overall things are going quite well for him.  He will continue on all of his present medications.     Medicare Attestation I have personally reviewed: The patient's medical and social history Their use of alcohol, tobacco or illicit drugs Their current medications and supplements The patient's functional ability including ADLs,fall risks, home safety risks, cognitive, and hearing and visual impairment Diet and physical activities Evidence for depression or mood disorders  The patient's weight, height, and BMI have been recorded in the chart.  I have made referrals,  counseling, and provided education to the patient based on review of the above and I have provided the patient with a written personalized care plan for preventive services.     Jill Alexanders, MD   12/30/2017

## 2017-12-31 LAB — CBC WITH DIFFERENTIAL/PLATELET
Basophils Absolute: 0 10*3/uL (ref 0.0–0.2)
Basos: 0 %
EOS (ABSOLUTE): 0.1 10*3/uL (ref 0.0–0.4)
EOS: 2 %
HEMATOCRIT: 46.3 % (ref 37.5–51.0)
Hemoglobin: 14.5 g/dL (ref 13.0–17.7)
Immature Grans (Abs): 0 10*3/uL (ref 0.0–0.1)
Immature Granulocytes: 0 %
LYMPHS ABS: 1.1 10*3/uL (ref 0.7–3.1)
Lymphs: 15 %
MCH: 28.7 pg (ref 26.6–33.0)
MCHC: 31.3 g/dL — AB (ref 31.5–35.7)
MCV: 92 fL (ref 79–97)
MONOS ABS: 0.5 10*3/uL (ref 0.1–0.9)
Monocytes: 7 %
Neutrophils Absolute: 5.5 10*3/uL (ref 1.4–7.0)
Neutrophils: 76 %
Platelets: 328 10*3/uL (ref 150–450)
RBC: 5.05 x10E6/uL (ref 4.14–5.80)
RDW: 14.1 % (ref 12.3–15.4)
WBC: 7.4 10*3/uL (ref 3.4–10.8)

## 2017-12-31 LAB — LIPID PANEL
Chol/HDL Ratio: 3.1 ratio (ref 0.0–5.0)
Cholesterol, Total: 157 mg/dL (ref 100–199)
HDL: 51 mg/dL (ref >39)
LDL CALC: 76 mg/dL (ref 0–99)
Triglycerides: 152 mg/dL — ABNORMAL HIGH (ref 0–149)
VLDL CHOLESTEROL CAL: 30 mg/dL (ref 5–40)

## 2018-01-09 DIAGNOSIS — H25812 Combined forms of age-related cataract, left eye: Secondary | ICD-10-CM | POA: Diagnosis not present

## 2018-01-09 DIAGNOSIS — H2512 Age-related nuclear cataract, left eye: Secondary | ICD-10-CM | POA: Diagnosis not present

## 2018-01-09 DIAGNOSIS — Z9849 Cataract extraction status, unspecified eye: Secondary | ICD-10-CM | POA: Diagnosis not present

## 2018-01-09 DIAGNOSIS — Z961 Presence of intraocular lens: Secondary | ICD-10-CM | POA: Diagnosis not present

## 2018-01-16 ENCOUNTER — Other Ambulatory Visit: Payer: Self-pay | Admitting: Family Medicine

## 2018-01-16 DIAGNOSIS — E785 Hyperlipidemia, unspecified: Secondary | ICD-10-CM

## 2018-02-25 DIAGNOSIS — H1820 Unspecified corneal edema: Secondary | ICD-10-CM | POA: Diagnosis not present

## 2018-03-04 DIAGNOSIS — C61 Malignant neoplasm of prostate: Secondary | ICD-10-CM | POA: Diagnosis not present

## 2018-03-11 DIAGNOSIS — C61 Malignant neoplasm of prostate: Secondary | ICD-10-CM | POA: Diagnosis not present

## 2018-03-17 ENCOUNTER — Other Ambulatory Visit: Payer: Self-pay | Admitting: Urology

## 2018-03-17 DIAGNOSIS — C61 Malignant neoplasm of prostate: Secondary | ICD-10-CM

## 2018-03-17 DIAGNOSIS — Z77018 Contact with and (suspected) exposure to other hazardous metals: Secondary | ICD-10-CM

## 2018-03-27 ENCOUNTER — Ambulatory Visit
Admission: RE | Admit: 2018-03-27 | Discharge: 2018-03-27 | Disposition: A | Discharge status: Home / Self Care | Payer: PPO | Source: Ambulatory Visit | Attending: Urology | Admitting: Urology

## 2018-03-27 DIAGNOSIS — Z77018 Contact with and (suspected) exposure to other hazardous metals: Secondary | ICD-10-CM

## 2018-03-27 DIAGNOSIS — Z87821 Personal history of retained foreign body fully removed: Secondary | ICD-10-CM | POA: Diagnosis not present

## 2018-04-01 ENCOUNTER — Ambulatory Visit
Admission: RE | Admit: 2018-04-01 | Discharge: 2018-04-01 | Disposition: A | Discharge status: Home / Self Care | Payer: PPO | Source: Ambulatory Visit | Attending: Urology | Admitting: Urology

## 2018-04-01 DIAGNOSIS — C61 Malignant neoplasm of prostate: Secondary | ICD-10-CM | POA: Diagnosis not present

## 2018-04-01 MED ORDER — GADOBENATE DIMEGLUMINE 529 MG/ML IV SOLN
20.0000 mL | Freq: Once | INTRAVENOUS | Status: AC | PRN
  Administered 2018-04-01: 20 mL via INTRAVENOUS

## 2018-04-17 DIAGNOSIS — C61 Malignant neoplasm of prostate: Secondary | ICD-10-CM | POA: Diagnosis not present

## 2018-04-17 HISTORY — PX: PROSTATE BIOPSY: SHX241

## 2018-04-29 DIAGNOSIS — H1851 Endothelial corneal dystrophy: Secondary | ICD-10-CM | POA: Diagnosis not present

## 2018-04-29 DIAGNOSIS — H1820 Unspecified corneal edema: Secondary | ICD-10-CM | POA: Diagnosis not present

## 2018-06-18 DIAGNOSIS — H1851 Endothelial corneal dystrophy: Secondary | ICD-10-CM | POA: Diagnosis not present

## 2018-06-18 DIAGNOSIS — Z961 Presence of intraocular lens: Secondary | ICD-10-CM | POA: Diagnosis not present

## 2018-07-28 ENCOUNTER — Other Ambulatory Visit: Payer: Self-pay | Admitting: Dermatology

## 2018-07-28 DIAGNOSIS — D229 Melanocytic nevi, unspecified: Secondary | ICD-10-CM | POA: Diagnosis not present

## 2018-07-28 DIAGNOSIS — D485 Neoplasm of uncertain behavior of skin: Secondary | ICD-10-CM | POA: Diagnosis not present

## 2018-07-28 DIAGNOSIS — L821 Other seborrheic keratosis: Secondary | ICD-10-CM | POA: Diagnosis not present

## 2018-07-28 DIAGNOSIS — L820 Inflamed seborrheic keratosis: Secondary | ICD-10-CM | POA: Diagnosis not present

## 2018-07-28 DIAGNOSIS — L570 Actinic keratosis: Secondary | ICD-10-CM | POA: Diagnosis not present

## 2018-08-12 ENCOUNTER — Telehealth: Payer: Self-pay

## 2018-08-12 DIAGNOSIS — B351 Tinea unguium: Secondary | ICD-10-CM

## 2018-08-12 MED ORDER — TERBINAFINE HCL 250 MG PO TABS: 250.0000 mg | ORAL_TABLET | Freq: Every day | ORAL | 0 refills | Status: DC

## 2018-08-12 NOTE — Telephone Encounter (Signed)
Patient has called to request a refill on medication for his toe fungus. Please advise

## 2018-08-12 NOTE — Telephone Encounter (Signed)
Please advise on refill on fungal medicine. Pt is requesting. [place]

## 2018-10-07 DIAGNOSIS — C61 Malignant neoplasm of prostate: Secondary | ICD-10-CM | POA: Diagnosis not present

## 2018-10-13 DIAGNOSIS — Z0000 Encounter for general adult medical examination without abnormal findings: Secondary | ICD-10-CM | POA: Diagnosis not present

## 2018-10-13 DIAGNOSIS — M109 Gout, unspecified: Secondary | ICD-10-CM | POA: Diagnosis not present

## 2018-10-13 DIAGNOSIS — Z79899 Other long term (current) drug therapy: Secondary | ICD-10-CM | POA: Diagnosis not present

## 2018-10-13 DIAGNOSIS — N281 Cyst of kidney, acquired: Secondary | ICD-10-CM | POA: Diagnosis not present

## 2018-10-13 DIAGNOSIS — E782 Mixed hyperlipidemia: Secondary | ICD-10-CM | POA: Diagnosis not present

## 2018-10-13 DIAGNOSIS — N5201 Erectile dysfunction due to arterial insufficiency: Secondary | ICD-10-CM | POA: Diagnosis not present

## 2018-10-13 DIAGNOSIS — C61 Malignant neoplasm of prostate: Secondary | ICD-10-CM | POA: Diagnosis not present

## 2018-10-13 DIAGNOSIS — K5730 Diverticulosis of large intestine without perforation or abscess without bleeding: Secondary | ICD-10-CM | POA: Diagnosis not present

## 2018-11-27 ENCOUNTER — Telehealth: Payer: Self-pay | Admitting: Internal Medicine

## 2018-11-27 DIAGNOSIS — B351 Tinea unguium: Secondary | ICD-10-CM

## 2018-11-27 MED ORDER — TERBINAFINE HCL 250 MG PO TABS: 250.0000 mg | ORAL_TABLET | Freq: Every day | ORAL | 0 refills | Status: DC

## 2018-11-27 NOTE — Telephone Encounter (Signed)
Pt called and left vm that he needs a refill on his nail fungus medicine to [facility]. Please advise

## 2019-01-12 DIAGNOSIS — C61 Malignant neoplasm of prostate: Secondary | ICD-10-CM | POA: Diagnosis not present

## 2019-01-12 LAB — PSA: PSA: 11.4

## 2019-01-19 DIAGNOSIS — N5201 Erectile dysfunction due to arterial insufficiency: Secondary | ICD-10-CM | POA: Diagnosis not present

## 2019-01-19 DIAGNOSIS — C61 Malignant neoplasm of prostate: Secondary | ICD-10-CM | POA: Diagnosis not present

## 2019-01-19 DIAGNOSIS — R351 Nocturia: Secondary | ICD-10-CM | POA: Diagnosis not present

## 2019-01-20 ENCOUNTER — Telehealth: Payer: Self-pay | Admitting: Radiation Oncology

## 2019-01-20 ENCOUNTER — Encounter: Payer: Self-pay | Admitting: *Deleted

## 2019-01-20 NOTE — Telephone Encounter (Signed)
New message: ° ° °LVM for patient to return call to schedule appt from referral received. °

## 2019-02-14 ENCOUNTER — Other Ambulatory Visit: Payer: Self-pay | Admitting: Family Medicine

## 2019-02-16 ENCOUNTER — Telehealth: Payer: Self-pay | Admitting: Radiation Oncology

## 2019-02-16 ENCOUNTER — Encounter: Payer: Self-pay | Admitting: Radiation Oncology

## 2019-02-16 NOTE — Progress Notes (Signed)
GU Location of Tumor / Histology: prostatic adenocarcinoma  If Prostate Cancer, Gleason Score is (3 + 4) and PSA is (11.4) drawn 01/12/2019. Prostate volume: 54.  Bernard Reed was diagnosed with prostate cancer in 2009 and has been under active surveillance for 11 years to include at least six prostate biopsies. Patient reports before he established care at [facility] he was seen twice at the [facility].  Biopsies of prostate (if applicable) revealed:    Past/Anticipated interventions by urology, if any: active surveillance to include five prostate biopsies, referral for consideration of radiotherapy  Past/Anticipated interventions by medical oncology, if any: no  Weight changes, if any: no  Bowel/Bladder complaints, if any: IPSS 8. SHIM 1. Reports ED. Reports he is no longer sexually active. Reports he hasn't been sexually active since he began taking Finasteride sometime in 2010. Denies urinary incontinence or having the sensation of not emptying his bladder completely. Reports rare scant urinary leakage when he attempt to hold his urine longer than he should Denies dysuria or hematuria. Reports a weak urine stream. Reports nocturia x 2.    Nausea/Vomiting, if any: no  Pain issues, if any:  denies  SAFETY ISSUES:  Prior radiation? no  Pacemaker/ICD? no  Possible current pregnancy? no, male patient  Is the patient on methotrexate? no  Current Complaints / other details:  73 year old male. Married. Denies having any children. Denies a family hx of prostate, breast, or colon cancer.

## 2019-02-17 ENCOUNTER — Encounter: Payer: Self-pay | Admitting: Radiation Oncology

## 2019-02-17 ENCOUNTER — Other Ambulatory Visit: Payer: Self-pay

## 2019-02-17 ENCOUNTER — Ambulatory Visit
Admission: RE | Admit: 2019-02-17 | Discharge: 2019-02-17 | Disposition: A | Discharge status: Home / Self Care | Payer: PPO | Source: Ambulatory Visit | Attending: Radiation Oncology | Admitting: Radiation Oncology

## 2019-02-17 VITALS — Ht 68.0 in | Wt 205.0 lb

## 2019-02-17 DIAGNOSIS — Z79899 Other long term (current) drug therapy: Secondary | ICD-10-CM | POA: Diagnosis not present

## 2019-02-17 DIAGNOSIS — C61 Malignant neoplasm of prostate: Secondary | ICD-10-CM | POA: Diagnosis not present

## 2019-02-17 DIAGNOSIS — R972 Elevated prostate specific antigen [PSA]: Secondary | ICD-10-CM | POA: Diagnosis not present

## 2019-02-17 DIAGNOSIS — N401 Enlarged prostate with lower urinary tract symptoms: Secondary | ICD-10-CM | POA: Diagnosis not present

## 2019-02-17 NOTE — Progress Notes (Signed)
See progress note under physician encounter. 

## 2019-02-17 NOTE — Progress Notes (Signed)
Radiation Oncology         [phone] ________________________________  Initial outpatient Consultation - Conducted via telephone due to current COVID-19 concerns for limiting patient exposure  Name: Bernard Reed MRN: [id number]  Date: 02/17/2019  DOB: Aug 11, 1945  [id number], Elyse Jarvis, MD  Ardis Hughs, MD   REFERRING PHYSICIAN: Ardis Hughs, MD  DIAGNOSIS: 73 y.o. gentleman with Stage T1c adenocarcinoma of the prostate with Gleason score of 3+4, and PSA of 11.4 ( 22.8 adjusted for finasteride).    ICD-10-CM   1. Prostate cancer [facility])  C61     HISTORY OF PRESENT ILLNESS: Bernard Reed is a 73 y.o. male with a diagnosis of prostate cancer. He is a well-established urology patient with a history of BPH with BOO, on finasteride since 2004, and initially diagnosed with Gleason 6 adenocarcinoma of the prostate in 2009, by Dr. Hessie Diener, with a PSA of 3.52 at the time of diagnosis.  At that time, he elected to proceed with active surveillance and has remained under close observation since that time with at least six additional biopsies since diagnosis, all confirming stable Gleason 6 disease until his most recent biopsy.  His care was transitioned to Dr. Louis Meckel following Dr. Elam Dutch retirement.  He has had a steady rise in his PSA as below: PSA 4.39 (8.8)  08/2014 PSA 5.42 (10.8) 08/2015 PSA 6.42 (12.8) 08/2017 PSA 11.0 (22)             10/06/2018  PSA 11.4 (22.8) 01/12/2019   Given the significant change in his PSA from 6.42 in 2019 to 11 in April 2020,  he had a prostate MRI for surveillance on 04/01/2018, which showed an 8 mm, PI-RADS 4 lesion in the left lateral mid gland peripheral zone.  There was no evidence of extracapsular extension or pelvic metastatic disease seen.  He then proceeded with MR fusion transrectal ultrasound of the prostate on 04/17/2018.  The prostate volume measured 53 cc.  Out of a total of 15 core biopsies, 2 were positive.  The maximum Gleason score  was 3+4, and this was seen in right apex. Gleason 3+3 was seen in left mid lateral.  3/3 region of interest  (ROI) were benign.  A repeat PSA was performed on 01/12/2019 and remained stable but elevated at 11.4 (22.8 when adjusted for finasteride).  The patient reviewed the biopsy results with his urologist and he has kindly been referred today for discussion of potential radiation treatment options.  PREVIOUS RADIATION THERAPY: No  PAST MEDICAL HISTORY:  Past Medical History:  Diagnosis Date  . Coronary artery disease    NONOBSTRUCTIVE  . Gallbladder disease   . GERD (gastroesophageal reflux disease)   . Hyperlipidemia   . Prostate cancer ([place])   . Seasonal allergies       PAST SURGICAL HISTORY: Past Surgical History:  Procedure Laterality Date  . CARDIAC CATHETERIZATION  07/16/2006   EF 50-55%  . CARDIOVASCULAR STRESS TEST  07/08/2006   EF 57%  . COLONOSCOPY  09/14/2008  . LAPAROSCOPIC CHOLECYSTECTOMY  2008  . PROSTATE BIOPSY  2009  . PROSTATE BIOPSY  06/27/2012  . PROSTATE BIOPSY  06/08/2013  . PROSTATE BIOPSY  03/19/2016  . PROSTATE BIOPSY  04/17/2018    FAMILY HISTORY:  Family History  Problem Relation Age of Onset  . Heart disease Mother   . Heart disease Father   . Hypertension Father   . Heart attack Father   . Colon cancer Neg Hx   .  Stomach cancer Neg Hx   . Rectal cancer Neg Hx   . Esophageal cancer Neg Hx   . Prostate cancer Neg Hx     SOCIAL HISTORY:  Social History   Socioeconomic History  . Marital status: Married    Spouse name: Izola Price Trebilcock  . Number of children: 0  . Years of education: Not on file  . Highest education level: Not on file  Occupational History    Comment: retired since last March. Began working again last week at [employer] part time.  Social Needs  . Financial resource strain: Not on file  . Food insecurity    Worry: Not on file    Inability: Not on file  . Transportation needs    Medical: Not on file     Non-medical: Not on file  Tobacco Use  . Smoking status: Former Smoker    Packs/day: 1.50    Years: 11.00    Pack years: 16.50    Types: Cigarettes    Quit date: 01/23/1971    Years since quitting: 48.1  . Smokeless tobacco: Never Used  Substance and Sexual Activity  . Alcohol use: Yes    Alcohol/week: 3.0 standard drinks    Types: 3 Standard drinks or equivalent per week  . Drug use: No  . Sexual activity: Yes  Lifestyle  . Physical activity    Days per week: Not on file    Minutes per session: Not on file  . Stress: Not on file  Relationships  . Social Herbalist on phone: Not on file    Gets together: Not on file    Attends religious service: Not on file    Active member of club or organization: Not on file    Attends meetings of clubs or organizations: Not on file    Relationship status: Not on file  . Intimate partner violence    Fear of current or ex partner: Not on file    Emotionally abused: Not on file    Physically abused: Not on file    Forced sexual activity: Not on file  Other Topics Concern  . Not on file  Social History Narrative  . Not on file    ALLERGIES: Patient has no known allergies.  MEDICATIONS:  Current Outpatient Medications  Medication Sig Dispense Refill  . aspirin 81 MG tablet Take 81 mg by mouth daily.    . finasteride (PROSCAR) 5 MG tablet Take 1 tablet (5 mg total) by mouth daily. 90 tablet 3  . rosuvastatin (CRESTOR) 10 MG tablet TAKE 1 TABLET BY MOUTH EVERY DAY 90 tablet 3  . terbinafine (LAMISIL) 250 MG tablet Take 1 tablet (250 mg total) by mouth daily. 90 tablet 0  . nitroGLYCERIN (NITROSTAT) 0.4 MG SL tablet Place 1 tablet (0.4 mg total) under the tongue every 5 (five) minutes as needed. (Patient not taking: Reported on 02/17/2019) 30 tablet 0   Current Facility-Administered Medications  Medication Dose Route Frequency Provider Last Rate Last Dose  . 0.9 %  sodium chloride infusion  500 mL Intravenous Continuous Irene Shipper, MD        REVIEW OF SYSTEMS:  On review of systems, the patient reports that he is doing well overall. He denies any chest pain, shortness of breath, cough, fevers, chills, night sweats, unintended weight changes. He denies any bowel disturbances, and denies abdominal pain, nausea or vomiting. He denies any new musculoskeletal or joint aches or pains. His  IPSS was 8, indicating moderate urinary symptoms despite taking finasteride daily since 2004. He reports rare scant leakage associated with prolonged postponement of voiding. He also reports a weak stream and nocturia x2. His SHIM was 1, indicating he has severe erectile dysfunction. A complete review of systems is obtained and is otherwise negative.    PHYSICAL EXAM:  Wt Readings from Last 3 Encounters:  02/17/19 205 lb (93 kg)  12/30/17 201 lb 9.6 oz (91.4 kg)  12/17/17 205 lb 3.2 oz (93.1 kg)   Temp Readings from Last 3 Encounters:  12/30/17 97.9 F (36.6 C)  12/17/17 (!) 97.4 F (36.3 C)  11/15/16 97.3 F (36.3 C) (Temporal)   BP Readings from Last 3 Encounters:  12/30/17 112/78  12/17/17 122/76  08/12/17 136/76   Pulse Readings from Last 3 Encounters:  12/30/17 73  12/17/17 68  08/12/17 65   Pain Assessment Pain Score: 0-No pain/10  Unable to assess given telephone consult format.  KPS = 90  100 - Normal; no complaints; no evidence of disease. 90   - Able to carry on normal activity; minor signs or symptoms of disease. 80   - Normal activity with effort; some signs or symptoms of disease. 9   - Cares for self; unable to carry on normal activity or to do active work. 60   - Requires occasional assistance, but is able to care for most of his personal needs. 50   - Requires considerable assistance and frequent medical care. 16   - Disabled; requires special care and assistance. 76   - Severely disabled; hospital admission is indicated although death not imminent. 20   - Very sick; hospital admission necessary;  active supportive treatment necessary. 10   - Moribund; fatal processes progressing rapidly. 0     - Dead  Karnofsky DA, Abelmann [place], Craver LS and Burchenal [facility] [phone]) The use of the nitrogen mustards in the palliative treatment of carcinoma: with particular reference to bronchogenic carcinoma Cancer 1 634-56  LABORATORY DATA:  Lab Results  Component Value Date   WBC 7.4 12/30/2017   HGB 14.5 12/30/2017   HCT 46.3 12/30/2017   MCV 92 12/30/2017   PLT 328 12/30/2017   Lab Results  Component Value Date   NA 142 12/17/2017   K 3.9 12/17/2017   CL 106 12/17/2017   CO2 22 12/17/2017   Lab Results  Component Value Date   ALT 13 12/17/2017   AST 12 12/17/2017   ALKPHOS 83 12/17/2017   BILITOT 0.4 12/17/2017     RADIOGRAPHY: No results found.    IMPRESSION/PLAN: 1. 73 y.o. gentleman with Stage T1c adenocarcinoma of the prostate with Gleason Score of 3+4, and PSA of 11.4 (22.8 when adjusted for finasteride). This visit was conducted via telephone to spare the patient unnecessary potential exposure in the healthcare setting during the current COVID-19 pandemic. We discussed the patient's workup and outlined the nature of prostate cancer in this setting. The patient's T stage, Gleason's score, and PSA put him into the intermediate risk group. Accordingly, he is eligible for a variety of potential treatment options including brachytherapy, 5.5 weeks of external radiation, or prostatectomy. We discussed the available radiation techniques, and focused on the details and logistics of delivery. We discussed and outlined the risks, benefits, short and long-term effects associated with radiotherapy and compared and contrasted these with prostatectomy. We discussed the role of SpaceOAR in reducing the rectal toxicity associated with radiotherapy.   At the end of the  conversation the patient is interested in moving forward with brachytherapy and use of SpaceOAR to reduce rectal toxicity from  radiotherapy.  He has a scheduled follow-up visit with Dr. Louis Meckel on 02/19/2019.  We will share our discussion with Dr. Louis Meckel and move forward with coordinating the procedure and scheduling his [facility] planning appointment in the near future.  The patient will be contacted by Romie Jumper in our office who will be working closely with him to coordinate OR scheduling and pre and post procedure appointments.  We will contact the pharmaceutical rep to ensure that Arnold is available at the time of procedure.  He will have a prostate MRI following his post-seed CT SIM to confirm appropriate distribution of the [place].  Given current concerns for patient exposure during the COVID-19 pandemic, this encounter was conducted via telephone. The patient was notified in advance and was offered a [place] meeting to allow for face to face communication but unfortunately reported that he did not have the appropriate resources/technology to support such a visit and instead preferred to proceed with telephone consult. The patient has given verbal consent for this type of encounter. The time spent during this encounter was 55 minutes. The attendants for this meeting include Tyler Pita MD, Ashlyn Bruning PA-C, [place], and patient Randie. During the encounter, Tyler Pita MD, Ashlyn Bruning PA-C, and scribe, Wilburn Mylar were located at [place].  Patient Shandell was located at home.    Nicholos Johns, PA-C    Tyler Pita, MD  [facility] Direct Dial: [phone]  Fax: [phone] [email or web address]  Skype  [email or web address]   This document serves as a record of services personally performed by Tyler Pita, MD and Freeman Caldron, PA-C. It was created on their behalf by Wilburn Mylar, a trained medical scribe. The creation of this record is based on the scribe's personal observations and the provider's  statements to them. This document has been checked and approved by the attending provider.

## 2019-02-18 ENCOUNTER — Telehealth: Payer: Self-pay | Admitting: *Deleted

## 2019-02-18 ENCOUNTER — Other Ambulatory Visit: Payer: Self-pay | Admitting: Urology

## 2019-02-18 NOTE — Telephone Encounter (Signed)
CALLED PATIENT TO ASK QUESTION, SPOKE WITH PATIENT 

## 2019-02-18 NOTE — Telephone Encounter (Signed)
CALLED PATIENT TO INFORM OF PRE-SEED APPTS. AND IMPLANT, LVM FOR A RETURN CALL

## 2019-02-19 DIAGNOSIS — C61 Malignant neoplasm of prostate: Secondary | ICD-10-CM | POA: Diagnosis not present

## 2019-03-04 ENCOUNTER — Telehealth: Payer: Self-pay | Admitting: *Deleted

## 2019-03-04 NOTE — Telephone Encounter (Signed)
CALLED PATIENT TO REMIND OF PRE-SEED APPTS. FOR 03-05-19- ARRIVAL TIME- 8:45 AM @ [facility], SPOKE WITH PATIENT AND HE IS AWARE OF THESE APPTS.

## 2019-03-05 ENCOUNTER — Other Ambulatory Visit: Payer: Self-pay

## 2019-03-05 ENCOUNTER — Ambulatory Visit (HOSPITAL_COMMUNITY)
Admission: RE | Admit: 2019-03-05 | Discharge: 2019-03-05 | Disposition: A | Discharge status: Home / Self Care | Payer: PPO | Source: Ambulatory Visit | Attending: Urology | Admitting: Urology

## 2019-03-05 ENCOUNTER — Ambulatory Visit
Admission: RE | Admit: 2019-03-05 | Discharge: 2019-03-05 | Disposition: A | Discharge status: Home / Self Care | Payer: PPO | Source: Ambulatory Visit | Attending: Radiation Oncology | Admitting: Radiation Oncology

## 2019-03-05 ENCOUNTER — Encounter (HOSPITAL_COMMUNITY)
Admission: RE | Admit: 2019-03-05 | Discharge: 2019-03-05 | Disposition: A | Discharge status: Home / Self Care | Payer: PPO | Source: Ambulatory Visit | Attending: Urology | Admitting: Urology

## 2019-03-05 ENCOUNTER — Encounter: Payer: Self-pay | Admitting: Medical Oncology

## 2019-03-05 ENCOUNTER — Ambulatory Visit
Admission: RE | Admit: 2019-03-05 | Discharge: 2019-03-05 | Disposition: A | Discharge status: Home / Self Care | Payer: PPO | Source: Ambulatory Visit | Attending: Urology | Admitting: Urology

## 2019-03-05 DIAGNOSIS — C61 Malignant neoplasm of prostate: Secondary | ICD-10-CM | POA: Insufficient documentation

## 2019-03-05 DIAGNOSIS — Z01818 Encounter for other preprocedural examination: Secondary | ICD-10-CM | POA: Diagnosis not present

## 2019-03-05 NOTE — Progress Notes (Signed)
  Radiation Oncology         [phone] ________________________________  Name: Palmer Carls MRN: [id number]  Date: 03/05/2019  DOB: 07-23-1945  SIMULATION AND TREATMENT PLANNING NOTE PUBIC ARCH STUDY  [id number], Elyse Jarvis, MD  Ardis Hughs, MD  DIAGNOSIS: 73 y.o. gentleman with Stage T1c adenocarcinoma of the prostate with Gleason score of 3+4, and PSA of 11.4 ( 22.8 adjusted for finasteride).     ICD-10-CM   1. Prostate cancer ([place])  C61     COMPLEX SIMULATION:  The patient presented today for evaluation for possible prostate seed implant. He was brought to the radiation planning suite and placed supine on the CT couch. A 3-dimensional image study set was obtained in upload to the planning computer. There, on each axial slice, I contoured the prostate gland. Then, using three-dimensional radiation planning tools I reconstructed the prostate in view of the structures from the transperineal needle pathway to assess for possible pubic arch interference. In doing so, I did not appreciate any pubic arch interference. Also, the patient's prostate volume was estimated based on the drawn structure. The volume was 53 cc.  Given the pubic arch appearance and prostate volume, patient remains a good candidate to proceed with prostate seed implant. Today, he freely provided informed written consent to proceed.    PLAN: The patient will undergo prostate seed implant.   ________________________________  Sheral Apley. Tammi Klippel, M.D.

## 2019-03-05 NOTE — Progress Notes (Signed)
Patient here for CT simulation, pre-seed implant. I introduced myself as the prostate nurse navigator and discussed my role. I was unable to meet him during consult with Dr. Tammi Klippel on  8/18. He states he was diagnosed with prostate cancer in 2004 and has been under active surveilliance. After his last biopsy, he has elected to move forward with treatment. He has chosen brachytherapy and is scheduled for surgery 03/25/19. We discussed the procedure and post op side effects. I gave him my business card and asked him to call me with questions or concerns. He voiced understanding.

## 2019-03-13 ENCOUNTER — Other Ambulatory Visit: Payer: Self-pay | Admitting: Urology

## 2019-03-13 DIAGNOSIS — C61 Malignant neoplasm of prostate: Secondary | ICD-10-CM

## 2019-03-19 ENCOUNTER — Encounter: Payer: Self-pay | Admitting: Family Medicine

## 2019-03-20 ENCOUNTER — Encounter (HOSPITAL_COMMUNITY)
Admission: RE | Admit: 2019-03-20 | Discharge: 2019-03-20 | Disposition: A | Discharge status: Home / Self Care | Payer: PPO | Source: Ambulatory Visit | Attending: Urology | Admitting: Urology

## 2019-03-20 ENCOUNTER — Encounter (INDEPENDENT_AMBULATORY_CARE_PROVIDER_SITE_OTHER): Payer: Self-pay

## 2019-03-20 ENCOUNTER — Other Ambulatory Visit: Payer: Self-pay

## 2019-03-20 DIAGNOSIS — Z01812 Encounter for preprocedural laboratory examination: Secondary | ICD-10-CM | POA: Diagnosis not present

## 2019-03-20 DIAGNOSIS — I491 Atrial premature depolarization: Secondary | ICD-10-CM | POA: Diagnosis not present

## 2019-03-20 LAB — COMPREHENSIVE METABOLIC PANEL
ALT: 14 U/L (ref 0–44)
AST: 15 U/L (ref 15–41)
Albumin: 4.1 g/dL (ref 3.5–5.0)
Alkaline Phosphatase: 72 U/L (ref 38–126)
Anion gap: 9 (ref 5–15)
BUN: 16 mg/dL (ref 8–23)
CO2: 24 mmol/L (ref 22–32)
Calcium: 8.6 mg/dL — ABNORMAL LOW (ref 8.9–10.3)
Chloride: 108 mmol/L (ref 98–111)
Creatinine, Ser: 0.93 mg/dL (ref 0.61–1.24)
GFR calc Af Amer: >60 mL/min (ref >60)
GFR calc non Af Amer: >60 mL/min (ref >60)
Glucose, Bld: 99 mg/dL (ref 70–99)
Potassium: 3.6 mmol/L (ref 3.5–5.1)
Sodium: 141 mmol/L (ref 135–145)
Total Bilirubin: 0.5 mg/dL (ref 0.3–1.2)
Total Protein: 6.9 g/dL (ref 6.5–8.1)

## 2019-03-20 LAB — CBC
HCT: 40.7 % (ref 39.0–52.0)
Hemoglobin: 13.5 g/dL (ref 13.0–17.0)
MCH: 30.1 pg (ref 26.0–34.0)
MCHC: 33.2 g/dL (ref 30.0–36.0)
MCV: 90.6 fL (ref 80.0–100.0)
Platelets: 273 10*3/uL (ref 150–400)
RBC: 4.49 MIL/uL (ref 4.22–5.81)
RDW: 12.9 % (ref 11.5–15.5)
WBC: 4.1 10*3/uL (ref 4.0–10.5)
nRBC: 0 % (ref 0.0–0.2)

## 2019-03-20 LAB — APTT: aPTT: 36 seconds (ref 24–36)

## 2019-03-20 LAB — PROTIME-INR
INR: 1 (ref 0.8–1.2)
Prothrombin Time: 12.9 seconds (ref 11.4–15.2)

## 2019-03-21 ENCOUNTER — Other Ambulatory Visit (HOSPITAL_COMMUNITY)
Admission: RE | Admit: 2019-03-21 | Discharge: 2019-03-21 | Disposition: A | Discharge status: Home / Self Care | Payer: PPO | Source: Ambulatory Visit | Attending: Urology | Admitting: Urology

## 2019-03-21 DIAGNOSIS — C61 Malignant neoplasm of prostate: Secondary | ICD-10-CM | POA: Diagnosis not present

## 2019-03-21 DIAGNOSIS — Z01812 Encounter for preprocedural laboratory examination: Secondary | ICD-10-CM | POA: Insufficient documentation

## 2019-03-21 DIAGNOSIS — Z20828 Contact with and (suspected) exposure to other viral communicable diseases: Secondary | ICD-10-CM | POA: Diagnosis not present

## 2019-03-22 LAB — NOVEL CORONAVIRUS, NAA (HOSP ORDER, SEND-OUT TO REF LAB; TAT 18-24 HRS): SARS-CoV-2, NAA: NOT DETECTED

## 2019-03-23 ENCOUNTER — Ambulatory Visit (INDEPENDENT_AMBULATORY_CARE_PROVIDER_SITE_OTHER): Payer: PPO | Admitting: Family Medicine

## 2019-03-23 ENCOUNTER — Encounter (HOSPITAL_BASED_OUTPATIENT_CLINIC_OR_DEPARTMENT_OTHER): Payer: Self-pay | Admitting: Emergency Medicine

## 2019-03-23 ENCOUNTER — Other Ambulatory Visit: Payer: Self-pay

## 2019-03-23 ENCOUNTER — Encounter: Payer: Self-pay | Admitting: Family Medicine

## 2019-03-23 VITALS — BP 140/88 | HR 84 | Temp 96.9°F | Ht 69.5 in | Wt 203.6 lb

## 2019-03-23 DIAGNOSIS — Z9841 Cataract extraction status, right eye: Secondary | ICD-10-CM | POA: Diagnosis not present

## 2019-03-23 DIAGNOSIS — Z9842 Cataract extraction status, left eye: Secondary | ICD-10-CM | POA: Diagnosis not present

## 2019-03-23 DIAGNOSIS — I2510 Atherosclerotic heart disease of native coronary artery without angina pectoris: Secondary | ICD-10-CM

## 2019-03-23 DIAGNOSIS — H269 Unspecified cataract: Secondary | ICD-10-CM

## 2019-03-23 DIAGNOSIS — B351 Tinea unguium: Secondary | ICD-10-CM | POA: Diagnosis not present

## 2019-03-23 DIAGNOSIS — I491 Atrial premature depolarization: Secondary | ICD-10-CM

## 2019-03-23 DIAGNOSIS — R296 Repeated falls: Secondary | ICD-10-CM

## 2019-03-23 DIAGNOSIS — C61 Malignant neoplasm of prostate: Secondary | ICD-10-CM | POA: Diagnosis not present

## 2019-03-23 DIAGNOSIS — Z87891 Personal history of nicotine dependence: Secondary | ICD-10-CM | POA: Diagnosis not present

## 2019-03-23 DIAGNOSIS — Z86010 Personal history of colonic polyps: Secondary | ICD-10-CM

## 2019-03-23 DIAGNOSIS — E785 Hyperlipidemia, unspecified: Secondary | ICD-10-CM | POA: Diagnosis not present

## 2019-03-23 DIAGNOSIS — R251 Tremor, unspecified: Secondary | ICD-10-CM | POA: Diagnosis not present

## 2019-03-23 DIAGNOSIS — Z0000 Encounter for general adult medical examination without abnormal findings: Secondary | ICD-10-CM

## 2019-03-23 DIAGNOSIS — Z136 Encounter for screening for cardiovascular disorders: Secondary | ICD-10-CM | POA: Diagnosis not present

## 2019-03-23 MED ORDER — TERBINAFINE HCL 250 MG PO TABS: 250.0000 mg | ORAL_TABLET | Freq: Every day | ORAL | 0 refills | Status: DC

## 2019-03-23 MED ORDER — ROSUVASTATIN CALCIUM 10 MG PO TABS: 10.0000 mg | ORAL_TABLET | Freq: Every day | ORAL | 3 refills | Status: DC

## 2019-03-23 NOTE — Progress Notes (Addendum)
Bernard Reed is a 73 y.o. male who presents for CPE,annual wellness visit and follow-up on chronic medical conditions.  He has had difficulty over the last year with falls.  He blames this on the right leg being numb as well as having some tremors.  He is noting that he is holding onto handrails much more than he did in the past.  His wife also says that he is having trouble with focus more so than in the past.  He is in the process of having seeds placed for treatment of prostate cancer.  He has had previous cataract surgery bilaterally and does seem to be doing well with this.  He does have erectile dysfunction but further questioning indicates that at this point he and his wife are not interested in him getting on Cialis.  He does have a history of PACs and coronary artery disease but apparently this was a remote history.  He has had no chest pain, shortness of breath, PND or DOE.  At this point he states he is not interested in further evaluation.  He does have a history of colonic polyps and is scheduled for follow-up.  He has had cataract surgery.  Social and family history was reviewed   Immunizations and Health Maintenance Immunization History  Administered Date(s) Administered  . Influenza, High Dose Seasonal PF 03/23/2014, 04/08/2015, 03/01/2017  . Pneumococcal Conjugate-13 03/23/2014  . Pneumococcal Polysaccharide-23 05/10/2015   Health Maintenance Due  Topic Date Due  . TETANUS/TDAP  08/25/1964  . INFLUENZA VACCINE  01/31/2019    Last colonoscopy: 11-15-16 Last PSA: 01-12-19 he is scheduled for seed implant. Dentist:  01/2019 Ophtho:06-18-18 Exercise: yard work   Other doctors caring for patient include:Bernard Reed [facility], Bernard Reed GI , Bernard Reed, Bernard Reed  Advanced Directives:  On file     Depression screen:  See questionnaire below.     Depression screen [facility] 2/9 12/30/2017 12/21/2016 03/23/2014  Decreased Interest 0 0 0  Down, Depressed, Hopeless 0 0 0  PHQ - 2 Score 0 0 0     Fall Screen: See Questionaire below.   Fall Risk  12/30/2017 12/21/2016 03/23/2014  Falls in the past year? No No No    ADL screen:  See questionnaire below.  Functional Status Survey:     Review of Systems  Constitutional: -, -unexpected weight change, -anorexia, -fatigue Allergy: -sneezing, -itching, -congestion Dermatology: denies changing moles, rash, lumps ENT: -runny nose, -ear pain, -sore throat,  Cardiology:  -chest pain, -palpitations, -orthopnea, Respiratory: -cough, -shortness of breath, -dyspnea on exertion, -wheezing,  Gastroenterology: -abdominal pain, -nausea, -vomiting, -diarrhea, -constipation, -dysphagia Hematology: -bleeding or bruising problems Musculoskeletal: -arthralgias, -myalgias, -joint swelling, -back pain, - Ophthalmology: -vision changes,  Urology: -dysuria, -difficulty urinating,  -urinary frequency, -urgency, incontinence Neurology: -, -numbness, , -memory loss, -falls, -dizziness    PHYSICAL EXAM:  General Appearance: Alert, cooperative, no distress, appears stated age Head: Normocephalic, without obvious abnormality, atraumatic Eyes: PERRL, conjunctiva/corneas clear, EOM's intact, fundi benign Ears: Normal TM's and external ear canals Nose: Nares normal, mucosa normal, no drainage or sinus   tenderness Throat: Lips, mucosa, and tongue normal; teeth and gums normal Neck: Supple, no lymphadenopathy, thyroid:no enlargement/tenderness/nodules; no carotid bruit or JVD Lungs: Clear to auscultation bilaterally without wheezes, rales or ronchi; respirations unlabored Heart: Regular rate and rhythm, S1 and S2 normal, no murmur, rub or gallop Abdomen: Soft, non-tender, nondistended, normoactive bowel sounds, no masses, no hepatosplenomegaly Extremities: No clubbing, cyanosis or edema.proximal clearing is noted of the toenails.  Pulses: 2+ and symmetric all extremities Skin: Skin color, texture, turgor normal, no rashes or lesions Lymph nodes:  Cervical, supraclavicular, and axillary nodes normal Neurologic: CNII-XII intact, normal strength, sensation and gait; reflexes 2+ and symmetric throughout   Psych: Normal mood, affect, hygiene and grooming  ASSESSMENT/PLAN: Prostate cancer (HCC)  Hyperlipidemia, unspecified hyperlipidemia type - Plan: rosuvastatin (CRESTOR) 10 MG tablet, Lipid panel  Coronary artery disease involving native coronary artery of native heart without angina pectoris  Cataract, unspecified cataract type, unspecified laterality  Premature atrial contractions  History of colonic polyps  Onychomycosis - Plan: terbinafine (LAMISIL) 250 MG tablet  Screening for AAA (abdominal aortic aneurysm) - Plan: US AORTA Since he has had several falls as well as a slight tremor and memory issues, neurology evaluation will be set up. Discussed the use of Cialis at this point he is not interested. Also discussed cardiac evaluation and again at this point he is not interested in further evaluation since he is not having any trouble.  Immunization recommendations discussed.  Colonoscopy recommendations reviewed.  He will continue follow-up with the various specialists.  Continue on present medications.  Medicare Attestation I have personally reviewed: The patient's medical and social history Their use of alcohol, tobacco or illicit drugs Their current medications and supplements The patient's functional ability including ADLs,fall risks, home safety risks, cognitive, and hearing and visual impairment Diet and physical activities Evidence for depression or mood disorders  The patient's weight, height, and BMI have been recorded in the chart.  I have made referrals, counseling, and provided education to the patient based on review of the above and I have provided the patient with a written personalized care plan for preventive services.     Bernard Alexanders, MD   03/23/2019

## 2019-03-23 NOTE — Progress Notes (Signed)
SPOKE WITH:patient  ARRIVAL TIME: 1115 RIDING HOME WITH: donna [phone] NPO STATUS: npo after mn AM MEDICATIONS: see epic  PRE-OP ORDERS: yes LABS:  COMMENTS/CONCERNS: aware to complete one fleets enema am of surgery  ICE,;RN, BSN.

## 2019-03-24 ENCOUNTER — Telehealth: Payer: Self-pay | Admitting: *Deleted

## 2019-03-24 ENCOUNTER — Encounter: Payer: Self-pay | Admitting: Neurology

## 2019-03-24 LAB — LIPID PANEL
Chol/HDL Ratio: 3.2 ratio (ref 0.0–5.0)
Cholesterol, Total: 171 mg/dL (ref 100–199)
HDL: 54 mg/dL (ref >39)
LDL Chol Calc (NIH): 97 mg/dL (ref 0–99)
Triglycerides: 110 mg/dL (ref 0–149)
VLDL Cholesterol Cal: 20 mg/dL (ref 5–40)

## 2019-03-24 NOTE — Telephone Encounter (Signed)
Called patient to remind of procedure for 03-25-19, spoke with patient and he is aware of this procedure

## 2019-03-25 ENCOUNTER — Ambulatory Visit (HOSPITAL_BASED_OUTPATIENT_CLINIC_OR_DEPARTMENT_OTHER)
Admission: RE | Admit: 2019-03-25 | Discharge: 2019-03-25 | Disposition: A | Discharge status: Home / Self Care | Payer: PPO | Attending: Urology | Admitting: Urology

## 2019-03-25 ENCOUNTER — Ambulatory Visit (HOSPITAL_BASED_OUTPATIENT_CLINIC_OR_DEPARTMENT_OTHER): Payer: PPO | Admitting: Certified Registered Nurse Anesthetist

## 2019-03-25 ENCOUNTER — Ambulatory Visit (HOSPITAL_COMMUNITY): Payer: PPO

## 2019-03-25 ENCOUNTER — Encounter (HOSPITAL_BASED_OUTPATIENT_CLINIC_OR_DEPARTMENT_OTHER)
Admission: RE | Disposition: A | Discharge status: Home / Self Care | Payer: Self-pay | Source: Home / Self Care | Attending: Urology

## 2019-03-25 ENCOUNTER — Other Ambulatory Visit: Payer: Self-pay

## 2019-03-25 ENCOUNTER — Encounter (HOSPITAL_BASED_OUTPATIENT_CLINIC_OR_DEPARTMENT_OTHER): Payer: Self-pay

## 2019-03-25 DIAGNOSIS — N401 Enlarged prostate with lower urinary tract symptoms: Secondary | ICD-10-CM | POA: Insufficient documentation

## 2019-03-25 DIAGNOSIS — E7800 Pure hypercholesterolemia, unspecified: Secondary | ICD-10-CM | POA: Insufficient documentation

## 2019-03-25 DIAGNOSIS — R3912 Poor urinary stream: Secondary | ICD-10-CM | POA: Insufficient documentation

## 2019-03-25 DIAGNOSIS — Z87891 Personal history of nicotine dependence: Secondary | ICD-10-CM | POA: Diagnosis not present

## 2019-03-25 DIAGNOSIS — C61 Malignant neoplasm of prostate: Secondary | ICD-10-CM | POA: Insufficient documentation

## 2019-03-25 DIAGNOSIS — Z7982 Long term (current) use of aspirin: Secondary | ICD-10-CM | POA: Insufficient documentation

## 2019-03-25 DIAGNOSIS — Z79899 Other long term (current) drug therapy: Secondary | ICD-10-CM | POA: Diagnosis not present

## 2019-03-25 DIAGNOSIS — Z1159 Encounter for screening for other viral diseases: Secondary | ICD-10-CM | POA: Diagnosis not present

## 2019-03-25 DIAGNOSIS — E785 Hyperlipidemia, unspecified: Secondary | ICD-10-CM | POA: Diagnosis not present

## 2019-03-25 DIAGNOSIS — I2510 Atherosclerotic heart disease of native coronary artery without angina pectoris: Secondary | ICD-10-CM | POA: Diagnosis not present

## 2019-03-25 HISTORY — PX: CYSTOSCOPY: SHX5120

## 2019-03-25 HISTORY — PX: RADIOACTIVE SEED IMPLANT: SHX5150

## 2019-03-25 HISTORY — PX: SPACE OAR INSTILLATION: SHX6769

## 2019-03-25 SURGERY — INSERTION, RADIATION SOURCE, PROSTATE
Anesthesia: General | Site: Rectum

## 2019-03-25 MED ORDER — MEPERIDINE HCL 25 MG/ML IJ SOLN
6.2500 mg | INTRAMUSCULAR | Status: DC | PRN
  Filled 2019-03-25: qty 1

## 2019-03-25 MED ORDER — ONDANSETRON HCL 4 MG/2ML IJ SOLN
INTRAMUSCULAR | Status: DC | PRN
  Administered 2019-03-25: 4 mg via INTRAVENOUS

## 2019-03-25 MED ORDER — PROPOFOL 10 MG/ML IV BOLUS
INTRAVENOUS | Status: AC
  Filled 2019-03-25: qty 20

## 2019-03-25 MED ORDER — TAMSULOSIN HCL 0.4 MG PO CAPS: 0.4000 mg | ORAL_CAPSULE | Freq: Every day | ORAL | 11 refills | Status: AC

## 2019-03-25 MED ORDER — PROPOFOL 10 MG/ML IV BOLUS
INTRAVENOUS | Status: DC | PRN
  Administered 2019-03-25: 150 mg via INTRAVENOUS
  Administered 2019-03-25: 50 mg via INTRAVENOUS

## 2019-03-25 MED ORDER — CIPROFLOXACIN IN D5W 400 MG/200ML IV SOLN
400.0000 mg | INTRAVENOUS | Status: AC
  Administered 2019-03-25: 400 mg via INTRAVENOUS
  Filled 2019-03-25: qty 200

## 2019-03-25 MED ORDER — TRAMADOL HCL 50 MG PO TABS: 50.0000 mg | ORAL_TABLET | Freq: Four times a day (QID) | ORAL | 0 refills | Status: DC | PRN

## 2019-03-25 MED ORDER — DEXAMETHASONE SODIUM PHOSPHATE 4 MG/ML IJ SOLN
INTRAMUSCULAR | Status: DC | PRN
  Administered 2019-03-25: 5 mg via INTRAVENOUS

## 2019-03-25 MED ORDER — MIDAZOLAM HCL 2 MG/2ML IJ SOLN
0.5000 mg | Freq: Once | INTRAMUSCULAR | Status: DC | PRN
  Filled 2019-03-25: qty 2

## 2019-03-25 MED ORDER — SODIUM CHLORIDE (PF) 0.9 % IJ SOLN
INTRAMUSCULAR | Status: DC | PRN
  Administered 2019-03-25: 10 mL

## 2019-03-25 MED ORDER — DEXAMETHASONE SODIUM PHOSPHATE 10 MG/ML IJ SOLN
INTRAMUSCULAR | Status: AC
  Filled 2019-03-25: qty 1

## 2019-03-25 MED ORDER — LACTATED RINGERS IV SOLN
INTRAVENOUS | Status: DC
  Administered 2019-03-25: 50 mL/h via INTRAVENOUS
  Administered 2019-03-25: 15:00:00 via INTRAVENOUS
  Filled 2019-03-25: qty 1000

## 2019-03-25 MED ORDER — MIDAZOLAM HCL 5 MG/5ML IJ SOLN
INTRAMUSCULAR | Status: DC | PRN
  Administered 2019-03-25: 1 mg via INTRAVENOUS

## 2019-03-25 MED ORDER — PROMETHAZINE HCL 25 MG/ML IJ SOLN
6.2500 mg | INTRAMUSCULAR | Status: DC | PRN
  Filled 2019-03-25: qty 1

## 2019-03-25 MED ORDER — ACETAMINOPHEN 500 MG PO TABS
1000.0000 mg | ORAL_TABLET | Freq: Once | ORAL | Status: AC
  Administered 2019-03-25: 1000 mg via ORAL
  Filled 2019-03-25: qty 2

## 2019-03-25 MED ORDER — ACETAMINOPHEN 500 MG PO TABS
ORAL_TABLET | ORAL | Status: AC
  Filled 2019-03-25: qty 2

## 2019-03-25 MED ORDER — LIDOCAINE 2% (20 MG/ML) 5 ML SYRINGE
INTRAMUSCULAR | Status: AC
  Filled 2019-03-25: qty 5

## 2019-03-25 MED ORDER — LIDOCAINE HCL (CARDIAC) PF 100 MG/5ML IV SOSY
PREFILLED_SYRINGE | INTRAVENOUS | Status: DC | PRN
  Administered 2019-03-25: 80 mg via INTRAVENOUS

## 2019-03-25 MED ORDER — SODIUM CHLORIDE 0.9 % IR SOLN
Status: DC | PRN
  Administered 2019-03-25: 1000 mL via INTRAVESICAL

## 2019-03-25 MED ORDER — HYDROMORPHONE HCL 1 MG/ML IJ SOLN
INTRAMUSCULAR | Status: AC
  Filled 2019-03-25: qty 1

## 2019-03-25 MED ORDER — IOHEXOL 300 MG/ML  SOLN
INTRAMUSCULAR | Status: DC | PRN
  Administered 2019-03-25: 14:00:00 7 mL

## 2019-03-25 MED ORDER — CIPROFLOXACIN IN D5W 400 MG/200ML IV SOLN
INTRAVENOUS | Status: AC
  Filled 2019-03-25: qty 200

## 2019-03-25 MED ORDER — STERILE WATER FOR INJECTION IJ SOLN
INTRAMUSCULAR | Status: DC | PRN
  Administered 2019-03-25: 3 mL

## 2019-03-25 MED ORDER — PHENAZOPYRIDINE HCL 200 MG PO TABS: 200.0000 mg | ORAL_TABLET | Freq: Three times a day (TID) | ORAL | 0 refills | Status: DC | PRN

## 2019-03-25 MED ORDER — FLEET ENEMA 7-19 GM/118ML RE ENEM
1.0000 | ENEMA | Freq: Once | RECTAL | Status: DC
  Filled 2019-03-25: qty 1

## 2019-03-25 MED ORDER — FENTANYL CITRATE (PF) 100 MCG/2ML IJ SOLN
INTRAMUSCULAR | Status: DC | PRN
  Administered 2019-03-25: 50 ug via INTRAVENOUS

## 2019-03-25 MED ORDER — MIDAZOLAM HCL 2 MG/2ML IJ SOLN
INTRAMUSCULAR | Status: AC
  Filled 2019-03-25: qty 2

## 2019-03-25 MED ORDER — FENTANYL CITRATE (PF) 100 MCG/2ML IJ SOLN
INTRAMUSCULAR | Status: AC
  Filled 2019-03-25: qty 2

## 2019-03-25 MED ORDER — HYDROMORPHONE HCL 1 MG/ML IJ SOLN
0.2500 mg | INTRAMUSCULAR | Status: DC | PRN
  Administered 2019-03-25: 0.25 mg via INTRAVENOUS
  Filled 2019-03-25: qty 0.5

## 2019-03-25 MED ORDER — ONDANSETRON HCL 4 MG/2ML IJ SOLN
INTRAMUSCULAR | Status: AC
  Filled 2019-03-25: qty 2

## 2019-03-25 SURGICAL SUPPLY — 34 items
BAG URINE DRAINAGE (UROLOGICAL SUPPLIES) ×5 IMPLANT
BLADE CLIPPER SENSICLIP SURGIC (BLADE) ×5 IMPLANT
CATH FOLEY 2WAY SLVR  5CC 16FR (CATHETERS) ×2
CATH FOLEY 2WAY SLVR 5CC 16FR (CATHETERS) ×3 IMPLANT
CATH ROBINSON RED A/P 16FR (CATHETERS) IMPLANT
CATH ROBINSON RED A/P 18FR (CATHETERS) ×2 IMPLANT
CATH ROBINSON RED A/P 20FR (CATHETERS) ×5 IMPLANT
CLOTH BEACON ORANGE TIMEOUT ST (SAFETY) ×5 IMPLANT
CONT SPECI 4OZ STER CLIK (MISCELLANEOUS) ×10 IMPLANT
COVER BACK TABLE 60X90IN (DRAPES) ×5 IMPLANT
COVER MAYO STAND STRL (DRAPES) ×5 IMPLANT
DRSG TEGADERM 4X4.75 (GAUZE/BANDAGES/DRESSINGS) ×7 IMPLANT
DRSG TEGADERM 8X12 (GAUZE/BANDAGES/DRESSINGS) ×5 IMPLANT
GAUZE SPONGE 4X4 12PLY STRL LF (GAUZE/BANDAGES/DRESSINGS) ×2 IMPLANT
GLOVE BIO SURGEON STRL SZ7.5 (GLOVE) ×10 IMPLANT
GLOVE BIO SURGEON STRL SZ8 (GLOVE) IMPLANT
GLOVE SURG ORTHO 8.5 STRL (GLOVE) ×6 IMPLANT
GLOVE SURG SS PI 6.5 STRL IVOR (GLOVE) ×2 IMPLANT
GOWN STRL REUS W/TWL XL LVL3 (GOWN DISPOSABLE) ×5 IMPLANT
HOLDER FOLEY CATH W/STRAP (MISCELLANEOUS) ×5 IMPLANT
I-Seed AgX100 ×150 IMPLANT
IMPL SPACEOAR SYSTEM 10ML (Spacer) IMPLANT
IMPLANT SPACEOAR SYSTEM 10ML (Spacer) ×5 IMPLANT
IV NS 1000ML (IV SOLUTION) ×5
IV NS 1000ML BAXH (IV SOLUTION) ×6 IMPLANT
KIT TURNOVER CYSTO (KITS) ×5 IMPLANT
MARKER SKIN DUAL TIP RULER LAB (MISCELLANEOUS) ×5 IMPLANT
PACK CYSTO (CUSTOM PROCEDURE TRAY) ×5 IMPLANT
SUT BONE WAX W31G (SUTURE) IMPLANT
SYR 10ML LL (SYRINGE) ×7 IMPLANT
TOWEL OR 17X26 10 PK STRL BLUE (TOWEL DISPOSABLE) ×5 IMPLANT
UNDERPAD 30X30 (UNDERPADS AND DIAPERS) ×10 IMPLANT
WATER STERILE IRR 3000ML UROMA (IV SOLUTION) IMPLANT
WATER STERILE IRR 500ML POUR (IV SOLUTION) ×5 IMPLANT

## 2019-03-25 NOTE — Discharge Instructions (Signed)
DISCHARGE INSTRUCTIONS FOR PROSTATE SEED IMPLANTATION  Diet Resume your usual diet when you return home. To keep your bowels moving easily and softly, drink prune, apple and cranberry juice at room temperature. You may also take a stool softener, such as Colace, which is available without prescription at local pharmacies. Daily activities ? No driving or heavy lifting for at least two days after the implant. ? No bike riding, horseback riding or riding lawn mowers for the first month after the implant. ? Any strenuous physical activity should be approved by your doctor before you resume it. Sexual relations You may resume sexual relations two weeks after the procedure. A condom should be used for the first two weeks. Your semen may be dark brown or black; this is normal and is related bleeding that may have occurred during the implant. Postoperative swelling Expect swelling and bruising of the scrotum and perineum (the area between the scrotum and anus). Both the swelling and the bruising should resolve in l or 2 weeks. Ice packs and over- the-counter medications such as Tylenol, Advil or Aleve may lessen your discomfort. Postoperative urination Most men experience burning on urination and/or urinary frequency. If this becomes bothersome, contact your Urologist.  Medication can be prescribed to relieve these problems.  It is normal to have some blood in your urine for a few days after the implant. Special instructions related to the seeds It is unlikely that you will pass an Iodine-125 seed in your urine. The seeds are silver in color and are about as large as a grain of rice. If you pass a seed, do not handle it with your fingers. Use a spoon to place it in an envelope or jar in place this in base occluded area such as the garage or basement for return to the radiation clinic at your convenience.  Contact your doctor for ? Temperature greater than 101 F ? Increasing pain ? Inability to  urinate Follow-up  You should have follow up with your urologist and radiation oncologist about 3 weeks after the procedure. General information regarding Iodine seeds ? Iodine-125 is a low energy radioactive material. It is not deeply penetrating and loses energy at short distances. Your prostate will absorb the radiation. Objects that are touched or used by the patient do not become radioactive. ? Body wastes (urine and stool) or body fluids (saliva, tears, semen or blood) are not radioactive. ? The Nuclear Regulatory Commission [facility] has determined that no radiation precautions are needed for patients undergoing Iodine-125 seed implantation. The [facility] states that such patients do not present a risk to the people around them, including young children and pregnant women. However, in keeping with the general principle that radiation exposure should be kept as low reasonably possible, we suggest the following: ? Children and pets should not sit on the patient's lap for the first two (2) weeks after the implant. ? Pregnant (or possibly pregnant) women should avoid prolonged, close contact with the patient for the first two (2) weeks after the implant. ? A distance of three (3) feet is acceptable. ? At a distance of three (3) feet, there is no limit to the length of time anyone can be with the patient.   Post Anesthesia Home Care Instructions  Activity: Get plenty of rest for the remainder of the day. A responsible adult should stay with you for 24 hours following the procedure.  For the next 24 hours, DO NOT: -Drive a car -Paediatric nurse -Drink alcoholic beverages -Take  any medication unless instructed by your physician -Make any legal decisions or sign important papers.  Meals: Start with liquid foods such as gelatin or soup. Progress to regular foods as tolerated. Avoid greasy, spicy, heavy foods. If nausea and/or vomiting occur, drink only clear liquids until the nausea and/or vomiting  subsides. Call your physician if vomiting continues.  Special Instructions/Symptoms: Your throat may feel dry or sore from the anesthesia or the breathing tube placed in your throat during surgery. If this causes discomfort, gargle with warm salt water. The discomfort should disappear within 24 hours.  If you had a scopolamine patch placed behind your ear for the management of post- operative nausea and/or vomiting:  1. The medication in the patch is effective for 72 hours, after which it should be removed.  Wrap patch in a tissue and discard in the trash. Wash hands thoroughly with soap and water. 2. You may remove the patch earlier than 72 hours if you experience unpleasant side effects which may include dry mouth, dizziness or visual disturbances. 3. Avoid touching the patch. Wash your hands with soap and water after contact with the patch.

## 2019-03-25 NOTE — H&P (Signed)
f/u for Prostate Cancer  HPI: Bernard Reed is a 73 year-old male established patient who is here for f/u while on Active Surveillance for Prostate Cancer .  The patient was last seen 01/19/2019.   He was diagnosed with prostate cancer in 09/11/2007. At the time of his prostate cancer diagnosis his PSA was 3.52. The patient's Gleason score at the time of diagnosis was 3+3=6. There were 1 positive cores on his initial biospy. He has had 5 biopsy(ies). The patient's most recent biopsy was 04/12/2018.   The patient has had a prostate MRI. This showed: PIRADs-4 - 40m left lateral mid gland.   His most recent PSA is 11.4. This was drawn on approximately 01/12/2019. PSA History: 10/06/18: 11.0, 9/19 :8.87 (17.6), 3/19: 6.42, 03/2017: 5.37, (on finasteride since 2004): 03/2016: 4.47, 08/2015: 5.42, 03/2015: 5.02, 08/2014: 4.39,.   He does have problems with erections. He does not have urinary incontinence. He does not have an abnormal sensation when he needs to urinate. He does not have to strain or bear down to start his urinary stream.   He is not having pain in new locations. He does have a good appetite. His bowels are moving normally. He has not seen blood in his stool since the biopsy. He has not recently had unwanted weight loss.   The patient is a longer sexually active.   The patient has had no progression of his lower urinary tract symptoms. He denies any hematuria dysuria. He continues on finasteride for which he has been on [place] long time now.   Fusion Biospy in 04/2018 - vol: 54, one core 3+4=7, one core 3+3=6.   Interval: The patient presents today to discuss his upcoming prostate cancer brachytherapy surgery. He has met with Dr. MTammi Klippeland has opted to proceed with treatment. The patient otherwise has no significant symptoms.     AUA Symptom Score: He never has the sensation of not emptying his bladder completely after finishing urinating. He never has to urinate again less that two hours  after he has finished urinating. He does not have to stop and start again several times when he urinates. He never finds it difficult to postpone urination. Almost always he has a weak urinary stream. He never has to push or strain to begin urination. He has to get up to urinate 2 times from the time he goes to bed until the time he gets up in the morning.   Calculated AUA Symptom Score: 7    QOL Score: He would feel pleased if he had to live with his urinary condition the way it is now for the rest of his life.   Calculated QOL Symptom Score: 1    ALLERGIES: No Allergies    MEDICATIONS: Finasteride 5 mg tablet 1 tablet PO Daily Keep scheduled appointment  Aspirin Low Dose 81 MG TABS Oral  Crestor 5 mg tablet Oral  Lamisil     GU PSH: Prostate Needle Biopsy - 04/17/2018, 2017, 2009       PDwightNotes: Cholecystectomy, Biopsy Of The Prostate Needle   NON-GU PSH: Cholecystectomy (open) - 2010 Surgical Pathology, Gross And Microscopic Examination For Prostate Needle - 04/17/2018, 2017     GU PMH: Prostate Cancer - 01/19/2019, - 10/13/2018, - 03/11/2018, - 09/09/2017, - 03/19/2017, The patient has very low risk prostate cancer and is on active surveillance. He has been on active surveillance since 2009 with no progression of his prostate cancer documented. His last biopsy was in September 2017., -  09-11-2016, Prostate cancer, - September 12, 2015 Urinary Obstruction - 03/19/2017 ED due to arterial insufficiency, The patient has erectile dysfunction to which she is not interested in any additional treatment at this time. 09-11-2016 BPH w/LUTS, Benign prostatic hyperplasia with urinary obstruction - 2013/09/11 Nocturia, Nocturia - 09-11-2013 Urinary Retention, Unspec, Incomplete bladder emptying - 09/11/13 Weak Urinary Stream, Weak urinary stream - 09/11/13 Elevated PSA, Elevated prostate specific antigen (PSA) - 2012/09/11, PSA,Elevated, - 2012-09-11 BPH w/o LUTS, Benign prostatic hypertrophy without lower urinary tract symptoms - 09-11-2012 Other  microscopic hematuria, Microscopic hematuria - Sep 11, 2012      PMH Notes: Diagnosed with low risk CaP in 09/12/2007, on active surveillance.  Diagnosis biopsy 09-12-2007: 3+3=6, 1 core, <5%.  Most recent biopsy: 03/2016 : Atypical glands, 1 cores  Started on finasteride 09-11-02. Currently: On finasteride.  His PSA has been as follows:  5.42 on 09/08/15  5.02 on 03/18/15  4.39 08/2014  4.69 (9.4) 03/04/14  4.58 (9.2) 12/04/13  5.8 (11.6) 09/14/13  3.52 (7) 03/30/13  3.41 (6.8) 12/04/12  4.41 (8.8) 09/19/12  2.63 (5.2) 09/05/11  2.8 (5.6) 06/01/11  4.17 (8.3) 11/29/10     NON-GU PMH: Encounter for general adult medical examination without abnormal findings, Encounter for preventive health examination - 09/12/15 Personal history of other diseases of the digestive system, History of esophageal reflux - 2012-09-11 Personal history of other endocrine, nutritional and metabolic disease, History of hypercholesterolemia - 2012/09/11    FAMILY HISTORY: Death In The Family Father - Runs In Family Death In The Family Mother - Runs In Family Death of family member - Runs In Family   SOCIAL HISTORY: Marital Status: Married Preferred Language: English; Ethnicity: Not Hispanic Or Latino; Race: White     Notes: Former smoker, Occupation:, Caffeine Use, Tobacco Use, Marital History - Currently Married, Alcohol Use   REVIEW OF SYSTEMS:    GU Review Male:   Patient denies frequent urination, hard to postpone urination, burning/ pain with urination, get up at night to urinate, leakage of urine, stream starts and stops, trouble starting your stream, have to strain to urinate , erection problems, and penile pain.  Gastrointestinal (Upper):   Patient denies nausea, vomiting, and indigestion/ heartburn.  Gastrointestinal (Lower):   Patient denies diarrhea and constipation.  Constitutional:   Patient denies fever, night sweats, weight loss, and fatigue.  Skin:   Patient denies skin rash/ lesion and itching.  Eyes:   Patient denies blurred vision and  double vision.  Ears/ Nose/ Throat:   Patient denies sore throat and sinus problems.  Hematologic/Lymphatic:   Patient denies swollen glands and easy bruising.  Cardiovascular:   Patient denies leg swelling and chest pains.  Respiratory:   Patient denies cough and shortness of breath.  Endocrine:   Patient denies excessive thirst.  Musculoskeletal:   Patient denies back pain and joint pain.  Neurological:   Patient denies headaches and dizziness.  Psychologic:   Patient denies depression and anxiety.   VITAL SIGNS: None   MULTI-SYSTEM PHYSICAL EXAMINATION:    Constitutional: Well-nourished. No physical deformities. Normally developed. Good grooming.  Respiratory: Normal breath sounds. No labored breathing, no use of accessory muscles.   Cardiovascular: Regular rate and rhythm. No murmur, no gallop. Normal temperature, normal extremity pulses, no swelling, no varicosities.      PAST DATA REVIEWED:  Source Of History:  Patient  Lab Test Review:   PSA  Records Review:   Pathology Reports, Previous Doctor Records, Previous Patient Records, POC Tool  Urine Test  Review:   Urinalysis   01/12/19 10/07/18 03/04/18 09/02/17 03/12/17 09/10/16 03/12/16 09/09/15  PSA  Total PSA 11.40 ng/mL 11.00 ng/mL 8.87 ng/mL 6.42 ng/mL 5.37 ng/mL 4.25 ng/dl 4.47  5.42   Free PSA   1.02 ng/mL 0.63 ng/mL 0.55 ng/mL 0.54 ng/dl    % Free PSA   11 % PSA 10 % PSA 10 % PSA 13 %      PROCEDURES: None   ASSESSMENT:      ICD-10 Details  1 GU:   Prostate Cancer - C61    PLAN:           Schedule Return Visit/Planned Activity: Keep Scheduled Appointment - Schedule Surgery          Document Letter(s):  Created for Patient: Clinical Summary         Notes:   Patient has intermediate risk prostate cancer and has opted to proceed with brachytherapy. He has met with radiation oncology and the plan has been approved. Surgery scheduled for September 23rd. All questions have been answered. Will plan for him to follow  up with me after the surgery in 3 weeks for a postoperative check.

## 2019-03-25 NOTE — Anesthesia Procedure Notes (Signed)
Procedure Name: LMA Insertion Date/Time: 03/25/2019 1:32 PM Performed by: Bufford Spikes, CRNA Pre-anesthesia Checklist: Patient identified, Emergency Drugs available, Suction available and Patient being monitored Patient Re-evaluated:Patient Re-evaluated prior to induction Oxygen Delivery Method: Circle system utilized Preoxygenation: Pre-oxygenation with 100% oxygen Induction Type: IV induction Ventilation: Mask ventilation without difficulty LMA: LMA inserted LMA Size: 4.0 Number of attempts: 1 Airway Equipment and Method: Bite block Placement Confirmation: positive ETCO2 Tube secured with: Tape Dental Injury: Teeth and Oropharynx as per pre-operative assessment

## 2019-03-25 NOTE — Transfer of Care (Signed)
Immediate Anesthesia Transfer of Care Note  Patient: Bernard Reed  Procedure(s) Performed: RADIOACTIVE SEED IMPLANT/BRACHYTHERAPY IMPLANT (N/A Prostate) SPACE OAR INSTILLATION (N/A Rectum) CYSTOSCOPY (N/A Bladder)  Patient Location: PACU  Anesthesia Type:General  Level of Consciousness: awake, alert  and oriented  Airway & Oxygen Therapy: Patient Spontanous Breathing and Patient connected to nasal cannula oxygen  Post-op Assessment: Report given to RN and Post -op Vital signs reviewed and stable  Post vital signs: Reviewed and stable  Last Vitals:  Vitals Value Taken Time  BP 148/89 03/25/19 1500  Temp    Pulse 78 03/25/19 1502  Resp 11 03/25/19 1502  SpO2 96 % 03/25/19 1502  Vitals shown include unvalidated device data.  Last Pain:  Vitals:   03/25/19 1115  TempSrc: Oral  PainSc: 0-No pain      Patients Stated Pain Goal: 6 ([phone] [id number])  Complications: No apparent anesthesia complications

## 2019-03-25 NOTE — Anesthesia Preprocedure Evaluation (Addendum)
Anesthesia Evaluation  Patient identified by MRN, date of birth, ID band Patient awake    Reviewed: Allergy & Precautions, NPO status , Patient's Chart, lab work & pertinent test results  History of Anesthesia Complications Negative for: history of anesthetic complications  Airway Mallampati: I  TM Distance: >3 FB Neck ROM: Full    Dental  (+) Caps, Dental Advisory Given   Pulmonary former smoker,  03/21/2019 SARS coronavirus NEG   breath sounds clear to auscultation       Cardiovascular (-) hypertension(-) angina Rhythm:Regular Rate:Normal  '08 Cardiac catheterization: EF 50-55%, non-obstructive '08 stress test: EF 57%     Neuro/Psych negative neurological ROS     GI/Hepatic negative GI ROS, Neg liver ROS,   Endo/Other  negative endocrine ROS  Renal/GU negative Renal ROS   Prostate cancer    Musculoskeletal   Abdominal   Peds  Hematology negative hematology ROS (+)   Anesthesia Other Findings   Reproductive/Obstetrics                            Anesthesia Physical Anesthesia Plan  ASA: II  Anesthesia Plan: General   Post-op Pain Management:    Induction: Intravenous  PONV Risk Score and Plan: 2 and Ondansetron and Dexamethasone  Airway Management Planned: LMA  Additional Equipment:   Intra-op Plan:   Post-operative Plan:   Informed Consent: I have reviewed the patients History and Physical, chart, labs and discussed the procedure including the risks, benefits and alternatives for the proposed anesthesia with the patient or authorized representative who has indicated his/her understanding and acceptance.     Dental advisory given  Plan Discussed with: CRNA and Surgeon  Anesthesia Plan Comments:         Anesthesia Quick Evaluation

## 2019-03-25 NOTE — Anesthesia Postprocedure Evaluation (Signed)
Anesthesia Post Note  Patient: Bernard Reed  Procedure(s) Performed: RADIOACTIVE SEED IMPLANT/BRACHYTHERAPY IMPLANT (N/A Prostate) SPACE OAR INSTILLATION (N/A Rectum) CYSTOSCOPY (N/A Bladder)     Patient location during evaluation: PACU Anesthesia Type: General Level of consciousness: awake and alert, patient cooperative and oriented Pain management: pain level controlled Vital Signs Assessment: post-procedure vital signs reviewed and stable Respiratory status: spontaneous breathing, nonlabored ventilation and respiratory function stable Cardiovascular status: blood pressure returned to baseline and stable Postop Assessment: no apparent nausea or vomiting Anesthetic complications: no    Last Vitals:  Vitals:   03/25/19 1115 03/25/19 1500  BP: (!) 144/77 (!) 148/89  Pulse: 66 81  Resp: 18 11  Temp: 36.8 C (!) 36.3 C  SpO2: 98% 97%    Last Pain:  Vitals:   03/25/19 1500  TempSrc:   PainSc: 0-No pain                 Kadrian Partch,E. Rhiannon Sassaman

## 2019-03-25 NOTE — Op Note (Signed)
Preoperative diagnosis: Clinical stage TI C adenocarcinoma the prostate  Postoperative diagnosis: Same  Procedure:  #1 I-125 prostate seed implantation  #2 cystourethroscopy #3 instillation of SpaceOAR biogel  Surgeon: Louis Meckel, M.D. Radiation Oncologist: Tyler Pita, M.D.  Anesthesia: Gen.   Indications: Patient  was diagnosed with clinical stage TI C prostate cancer. We had extensive discussion with him about treatment options versus. He elected to proceed with seed implantation. He underwent consultation my office as well as with Dr. Tyler Pita. He appeared to understand the advantages disadvantages potential risks of this treatment option. Full informed consent has been obtained. The patient is had preoperative ciprofloxacin. PAS compression boots were placed.   Technique and findings: Patient was brought the operating room where he had  successful induction of general anesthesia. He was placed in lithotomy position and prepped and draped in usual manner. Appropriate surgical timeout was performed. Radiation oncology department placed a transrectal ultrasound probe anchoring stand. Foley catheter with contrast in the balloon was inserted without difficulty. Anchoring needles were placed within the prostate. Real-time contouring of the urethra prostate and rectum were performed and the dosing parameters were established. Targeted dose was 145 gray. We then came to the operating suite suite for placement of the needles. A second timeout was performed. All needle passage was done with real-time transrectal ultrasound guidance with the sagittal plane. A total of 26 needles were placed. See implantation itself was done with the robotic implanter. 75 active seeds were implanted. The brachytherapy template was then removed.   A site in the midline was selected on the perineum for placement of an 18 g needle with saline.  The needle was advanced above the rectum and below Denonvillier's  fascia to the mid gland and confirmed to be in the midline on transverse imaging.  One cc of saline was injected confirming appropriate expansion of this space.  A total of 5 cc of saline was then injected to open the space further bilaterally.  The saline syringe was then removed and the SpaceOAR hydrogel was injected with good distribution bilaterally.A Foley catheter was removed and flexible cystoscopy failed to show any seeds outside the prostate.

## 2019-03-25 NOTE — Interval H&P Note (Signed)
History and Physical Interval Note:  03/25/2019 12:12 PM  Bernard Reed  has presented today for surgery, with the diagnosis of PROSTATE CANCER.  The various methods of treatment have been discussed with the patient and family. After consideration of risks, benefits and other options for treatment, the patient has consented to  Procedure(s): RADIOACTIVE SEED IMPLANT/BRACHYTHERAPY IMPLANT (N/A) SPACE OAR INSTILLATION (N/A) as a surgical intervention.  The patient's history has been reviewed, patient examined, no change in status, stable for surgery.  I have reviewed the patient's chart and labs.  Questions were answered to the patient's satisfaction.     Ardis Hughs

## 2019-03-26 ENCOUNTER — Telehealth: Payer: Self-pay | Admitting: *Deleted

## 2019-03-26 NOTE — Telephone Encounter (Signed)
Called patient to ask question, lvm for a return call 

## 2019-03-27 ENCOUNTER — Encounter (HOSPITAL_BASED_OUTPATIENT_CLINIC_OR_DEPARTMENT_OTHER): Payer: Self-pay | Admitting: Urology

## 2019-03-27 ENCOUNTER — Telehealth: Payer: Self-pay | Admitting: *Deleted

## 2019-03-27 NOTE — Telephone Encounter (Signed)
CALLED PATIENT TO INFORM THAT POST SEED APPTS. AND MRI HAVE BEEN MOVED TO 04-15-19- 2:45 PM- ARRIVAL TIME @ [facility], SPOKE WITH PATIENT AND HE IS AWARE OF THESE APPTS. AND IS GOOD WITH THEM

## 2019-03-29 NOTE — Progress Notes (Signed)
  Radiation Oncology         [phone] ________________________________  Name: Olayinka Kunzman MRN: [id number]  Date: 03/29/2019  DOB: 1945/07/05       Prostate Seed Implant  [id number], Elyse Jarvis, MD  No ref. provider found  DIAGNOSIS:  73 y.o. gentleman with Stage T1c adenocarcinoma of the prostate with Gleason score of 3+4, and PSA of 11.4 ( 22.8 adjusted for finasteride).     ICD-10-CM   1. Prostate cancer ([facility])  C61 DG Chest 2 View    DG Chest 2 View    Discharge patient    PROCEDURE: Insertion of radioactive I-125 seeds into the prostate gland.  RADIATION DOSE: 145 Gy, definitive therapy.  TECHNIQUE: Aram Cada was brought to the operating room with the urologist. He was placed in the dorsolithotomy position. He was catheterized and a rectal tube was inserted. The perineum was shaved, prepped and draped. The ultrasound probe was then introduced into the rectum to see the prostate gland.  TREATMENT DEVICE: A needle grid was attached to the ultrasound probe stand and anchor needles were placed.  3D PLANNING: The prostate was imaged in 3D using a sagittal sweep of the prostate probe. These images were transferred to the planning computer. There, the prostate, urethra and rectum were defined on each axial reconstructed image. Then, the software created an optimized 3D plan and a few seed positions were adjusted. The quality of the plan was reviewed using [facility] information for the target and the following two organs at risk:  Urethra and Rectum.  Then the accepted plan was printed and handed off to the radiation therapist.  Under my supervision, the custom loading of the seeds and spacers was carried out and loaded into sealed vicryl sleeves.  These pre-loaded needles were then placed into the needle holder.Marland Kitchen  PROSTATE VOLUME STUDY:  Using transrectal ultrasound the volume of the prostate was verified to be 69.4 cc.  SPECIAL TREATMENT PROCEDURE/SUPERVISION AND HANDLING: The pre-loaded  needles were then delivered under sagittal guidance. A total of 26 needles were used to deposit 75 seeds in the prostate gland. The individual seed activity was 0.582 mCi.  SpaceOAR:  Yes  COMPLEX SIMULATION: At the end of the procedure, an anterior radiograph of the pelvis was obtained to document seed positioning and count. Cystoscopy was performed to check the urethra and bladder.  MICRODOSIMETRY: At the end of the procedure, the patient was emitting 0.23 mR/hr at 1 meter. Accordingly, he was considered safe for hospital discharge.  PLAN: The patient will return to the radiation oncology clinic for post implant CT dosimetry in three weeks.   ________________________________  Sheral Apley Tammi Klippel, M.D.

## 2019-04-06 ENCOUNTER — Ambulatory Visit (HOSPITAL_COMMUNITY): Payer: PPO

## 2019-04-14 ENCOUNTER — Telehealth: Payer: Self-pay | Admitting: *Deleted

## 2019-04-14 NOTE — Telephone Encounter (Signed)
Called patient to remind of post seed appts. and MRI for 04-15-19, lvm for a return call

## 2019-04-15 ENCOUNTER — Ambulatory Visit
Admission: RE | Admit: 2019-04-15 | Discharge: 2019-04-15 | Disposition: A | Discharge status: Home / Self Care | Payer: PPO | Source: Ambulatory Visit | Attending: Radiation Oncology | Admitting: Radiation Oncology

## 2019-04-15 ENCOUNTER — Other Ambulatory Visit: Payer: Self-pay

## 2019-04-15 ENCOUNTER — Ambulatory Visit (HOSPITAL_COMMUNITY)
Admission: RE | Admit: 2019-04-15 | Discharge: 2019-04-15 | Disposition: A | Discharge status: Home / Self Care | Payer: PPO | Source: Ambulatory Visit | Attending: Urology | Admitting: Urology

## 2019-04-15 ENCOUNTER — Ambulatory Visit
Admission: RE | Admit: 2019-04-15 | Discharge: 2019-04-15 | Disposition: A | Discharge status: Home / Self Care | Payer: PPO | Source: Ambulatory Visit | Attending: Urology | Admitting: Urology

## 2019-04-15 ENCOUNTER — Encounter: Payer: Self-pay | Admitting: Radiation Oncology

## 2019-04-15 VITALS — BP 142/81 | HR 96 | Temp 98.0°F | Resp 18 | Wt 204.4 lb

## 2019-04-15 DIAGNOSIS — C61 Malignant neoplasm of prostate: Secondary | ICD-10-CM | POA: Diagnosis not present

## 2019-04-15 NOTE — Progress Notes (Signed)
Patient in post seed doing fair. Continues to have issues with nocturia even with flomax.

## 2019-04-15 NOTE — Patient Instructions (Signed)
Coronavirus (COVID-19) Are you at risk?  Are you at risk for the Coronavirus (COVID-19)?  To be considered HIGH RISK for Coronavirus (COVID-19), you have to meet the following criteria:  . Traveled to [place], [place], [place], [place] or [place]; or in the [place] to [place], [place], [place], or [place]; and have fever, cough, and shortness of breath within the last 2 weeks of travel OR . Been in close contact with a person diagnosed with COVID-19 within the last 2 weeks and have fever, cough, and shortness of breath . IF YOU DO NOT MEET THESE CRITERIA, YOU ARE CONSIDERED LOW RISK FOR COVID-19.  What to do if you are HIGH RISK for COVID-19?  . If you are having a medical emergency, call 911. . Seek medical care right away. Before you go to a doctor's office, urgent care or emergency department, call ahead and tell them about your recent travel, contact with someone diagnosed with COVID-19, and your symptoms. You should receive instructions from your physician's office regarding next steps of care.  . When you arrive at healthcare provider, tell the healthcare staff immediately you have returned from visiting [place], [place], [place], [place] or [place]; or traveled in the [place] to [place], [place], [place], or [place]; in the last two weeks or you have been in close contact with a person diagnosed with COVID-19 in the last 2 weeks.   . Tell the health care staff about your symptoms: fever, cough and shortness of breath. . After you have been seen by a medical provider, you will be either: o Tested for (COVID-19) and discharged home on quarantine except to seek medical care if symptoms worsen, and asked to  - Stay home and avoid contact with others until you get your results (4-5 days)  - Avoid travel on public transportation if possible (such as bus, train, or airplane) or o Sent to the Emergency Department by EMS for evaluation, COVID-19 testing, and possible  admission depending on your condition and test results.  What to do if you are LOW RISK for COVID-19?  Reduce your risk of any infection by using the same precautions used for avoiding the common cold or flu:  . Wash your hands often with soap and warm water for at least 20 seconds.  If soap and water are not readily available, use an alcohol-based hand sanitizer with at least 60% alcohol.  . If coughing or sneezing, cover your mouth and nose by coughing or sneezing into the elbow areas of your shirt or coat, into a tissue or into your sleeve (not your hands). . Avoid shaking hands with others and consider head nods or verbal greetings only. . Avoid touching your eyes, nose, or mouth with unwashed hands.  . Avoid close contact with people who are sick. . Avoid places or events with large numbers of people in one location, like concerts or sporting events. . Carefully consider travel plans you have or are making. . If you are planning any travel outside or inside the [place], visit the CDC's Travelers' Health webpage for the latest health notices. . If you have some symptoms but not all symptoms, continue to monitor at home and seek medical attention if your symptoms worsen. . If you are having a medical emergency, call 911.   ADDITIONAL HEALTHCARE OPTIONS FOR PATIENTS  [facility] / e-Visit: [email or web address]         [facility]: [phone]  [facility]: [phone]                   [facility]: [phone]   

## 2019-04-15 NOTE — Progress Notes (Signed)
Radiation Oncology         ([phone]) [phone] ________________________________  Name: Bernard Reed MRN: [id number]  Date: 04/15/2019  DOB: [birth year]  Post-Seed Follow-Up Visit Note  CC: Denita Lung, MD  Ardis Hughs, MD  Diagnosis:   73 y.o.gentleman with Stage T1cadenocarcinoma of the prostate with Gleason score of 3+4, and PSA of11.4 (22.8 adjusted forfinasteride).     ICD-10-CM   1. Prostate cancer ([facility])  C61     Interval Since Last Radiation:  3 weeks 03/25/19:  Insertion of radioactive I-125 seeds into the prostate gland; 145 Gy, definitive/boost therapy with placement of SpaceOAR gel.  Narrative:  The patient returns today for routine follow-up.  He is complaining of increased urinary frequency and urinary hesitation symptoms. He filled out a questionnaire regarding urinary function today providing and overall IPSS score of 19 characterizing his symptoms as moderate-severe with increased frequency, urgency, weak stream and nocturia x4-5/night, despite taking Flomax and Finasteride daily.  He denies gross hematuria, dysuria, straining to void or incomplete bladder emptying.  His pre-implant score was 8. He denies any bowel symptoms.  ALLERGIES:  has No Known Allergies.  Meds: Current Outpatient Medications  Medication Sig Dispense Refill  . aspirin 81 MG tablet Take 81 mg by mouth daily.    . finasteride (PROSCAR) 5 MG tablet Take 1 tablet (5 mg total) by mouth daily. 90 tablet 3  . rosuvastatin (CRESTOR) 10 MG tablet Take 1 tablet (10 mg total) by mouth daily. 90 tablet 3  . tamsulosin (FLOMAX) 0.4 MG CAPS capsule Take 1 capsule (0.4 mg total) by mouth daily. 30 capsule 11  . terbinafine (LAMISIL) 250 MG tablet Take 1 tablet (250 mg total) by mouth daily. 90 tablet 0   No current facility-administered medications for this encounter.     Physical Findings: In general this is a well appearing Caucasian male in no acute distress. He's alert and oriented x4 and  appropriate throughout the examination. Cardiopulmonary assessment is negative for acute distress and he exhibits normal effort.   Lab Findings: Lab Results  Component Value Date   WBC 4.1 03/20/2019   HGB 13.5 03/20/2019   HCT 40.7 03/20/2019   MCV 90.6 03/20/2019   PLT 273 03/20/2019    Radiographic Findings:  Patient underwent CT imaging in our clinic for post implant dosimetry. The CT will be reviewed by Dr. Tammi Klippel to confirm there is an adequate distribution of radioactive seeds throughout the prostate gland and ensure that there are no seeds in or near the rectum. His scheduled for prostate MRI at 7pm tonight and those images will be fused with his CT images for further evaluation. We suspect the final radiation plan and dosimetry will show appropriate coverage of the prostate gland. He understands that we will call and inform him of any unexpected findings on further review of his imaging and dosimetry.  Impression/Plan: 73 y.o.gentleman with Stage T1cadenocarcinoma of the prostate with Gleason score of 3+4, and PSA of11.4 (22.8 adjusted forfinasteride).  The patient is recovering from the effects of radiation. His urinary symptoms should gradually improve over the next 4-6 months. We talked about this today. He is encouraged by his gradual improvement already and is otherwise pleased with his outcome. We discussed increasing his Flomax to two capsules after dinner each evening and continuing the finasteride daily. We also talked about long-term follow-up for prostate cancer following seed implant. He understands that ongoing PSA determinations and digital rectal exams will help perform surveillance to rule  out disease recurrence. He has a follow up appointment scheduled with Dr. Louis Meckel on 04/24/19. He understands what to expect with his PSA measures. Patient was also educated today about some of the long-term effects from radiation including a small risk for rectal bleeding and  possibly erectile dysfunction. We talked about some of the general management approaches to these potential complications. However, I did encourage the patient to contact our office or return at any point if he has questions or concerns related to his previous radiation and prostate cancer.    Nicholos Johns, PA-C

## 2019-04-15 NOTE — Progress Notes (Signed)
  Radiation Oncology         [phone] ________________________________  Name: Bernard Reed MRN: [id number]  Date: 04/15/2019  DOB: 08-06-1945  COMPLEX SIMULATION NOTE  NARRATIVE:  The patient was brought to the [facility] today following prostate seed implantation approximately one month ago.  Identity was confirmed.  All relevant records and images related to the planned course of therapy were reviewed.  Then, the patient was set-up supine.  CT images were obtained.  The CT images were loaded into the planning software.  Then the prostate and rectum were contoured.  Treatment planning then occurred.  The implanted iodine 125 seeds were identified by the physics staff for projection of radiation distribution  I have requested : 3D Simulation  I have requested a DVH of the following structures: Prostate and rectum.    ________________________________  Sheral Apley Tammi Klippel, M.D.

## 2019-04-23 ENCOUNTER — Ambulatory Visit
Admission: RE | Admit: 2019-04-23 | Discharge: 2019-04-23 | Disposition: A | Discharge status: Home / Self Care | Payer: PPO | Source: Ambulatory Visit | Attending: Family Medicine | Admitting: Family Medicine

## 2019-04-23 DIAGNOSIS — Z87891 Personal history of nicotine dependence: Secondary | ICD-10-CM | POA: Diagnosis not present

## 2019-04-24 ENCOUNTER — Ambulatory Visit: Payer: PPO | Admitting: Neurology

## 2019-04-24 DIAGNOSIS — C61 Malignant neoplasm of prostate: Secondary | ICD-10-CM | POA: Diagnosis not present

## 2019-04-27 ENCOUNTER — Encounter: Payer: Self-pay | Admitting: Medical Oncology

## 2019-04-27 NOTE — Progress Notes (Signed)
Bernard Reed was seen today in the movement disorders clinic for neurologic consultation at the request of Denita Lung, MD.  The consultation is for the evaluation of falls and tremor.   Specific Symptoms:  Tremor: Yes.   per wife but pt states that he doesn't notice.  He cannot state if it is at rest.  Doesn't interfere with anything.   Family hx of similar:  Yes.  , mom, GF and maternal uncle had tremor in their younger years (younger than pt) - tremor in the hands - unknown diagnosis Voice: no change per pt Sleep: trouble getting and staying asleep  Vivid Dreams:  No.  Acting out dreams:  No. Wet Pillows: No. Postural symptoms:  Yes.   x few years.    Falls?  Yes.   pt states that recent falls were due going down stairs and feet slid out from "underneath me" - was  wearing socks without grips.  Worst fall was outside carrying a ladder and lost balance and fell with the ladder and hit knees.  He thinks that the falls made him unsure of himself and he now will graze the wall just for balance and that seems to help.  R leg is numb/tingling for about several months - started at foot and worked way up to knee - worse if laying in bed.  No DM Bradykinesia symptoms: slow to get out of his easy chair; no change in stride length and "i don't shuffle"; my R foot may drag sometimes Loss of smell:  No. Loss of taste:  No. Urinary Incontinence:  No., but has nocturia but has prostate CA and just had radiation seeds placed and getting up hourly to use BR Difficulty Swallowing:  No. Handwriting, micrographia: No. Trouble with ADL's:  No.  Trouble buttoning clothing: No. Depression:  No. (wife reports "quieter and not normal personality") Memory changes:  No. but wife reports "confused" - pt thinks that just didn't pay attention and wife does report doesn't focus Hallucinations:  No.  visual distortions: Yes.   N/V:  No. Lightheaded:  No.  Syncope: No. Diplopia:  No. but states that since  childhood he has always had an area of blindspot in central vision and comes on about 1 time every 2 months (he was told it was "blinding headache" - has scintillating scotomas with it) Dyskinesia:  No. Prior exposure to reglan/antipsychotics: No.  Neuroimaging of the brain has not previously been performed.    PREVIOUS MEDICATIONS: none to date  ALLERGIES:  No Known Allergies  CURRENT MEDICATIONS:  Current Outpatient Medications  Medication Instructions  . aspirin 81 mg, Oral, Daily  . finasteride (PROSCAR) 5 mg, Oral, Daily  . rosuvastatin (CRESTOR) 10 mg, Oral, Daily  . tamsulosin (FLOMAX) 0.4 mg, Oral, Daily  . terbinafine (LAMISIL) 250 mg, Oral, Daily    PAST MEDICAL HISTORY:   Past Medical History:  Diagnosis Date  . Coronary artery disease    NONOBSTRUCTIVE  . Gallbladder disease   . GERD (gastroesophageal reflux disease)   . Hyperlipidemia   . Migraine aura without headache   . Prostate cancer ([place])   . Seasonal allergies     PAST SURGICAL HISTORY:   Past Surgical History:  Procedure Laterality Date  . CARDIAC CATHETERIZATION  07/16/2006   EF 50-55%  . CARDIOVASCULAR STRESS TEST  07/08/2006   EF 57%  . CATARACT EXTRACTION, BILATERAL    . COLONOSCOPY  09/14/2008  . CYSTOSCOPY N/A 03/25/2019   Procedure: CYSTOSCOPY;  Surgeon: Ardis Hughs, MD;  Location: [facility];  Service: Urology;  Laterality: N/A;  No seeds detected in bladder per Dr. Louis Meckel  . LAPAROSCOPIC CHOLECYSTECTOMY  2008  . PROSTATE BIOPSY  2009  . PROSTATE BIOPSY  06/27/2012  . PROSTATE BIOPSY  06/08/2013  . PROSTATE BIOPSY  03/19/2016  . PROSTATE BIOPSY  04/17/2018  . RADIOACTIVE SEED IMPLANT N/A 03/25/2019   Procedure: RADIOACTIVE SEED IMPLANT/BRACHYTHERAPY IMPLANT;  Surgeon: Ardis Hughs, MD;  Location[facility];  Service: Urology;  Laterality: N/A;  . SPACE OAR INSTILLATION N/A 03/25/2019   Procedure: SPACE OAR INSTILLATION;  Surgeon: Ardis Hughs, MD;  Location: [facility];  Service: Urology;  Laterality: N/A;    SOCIAL HISTORY:   Social History   Socioeconomic History  . Marital status: Married    Spouse name: Izola Price Croucher  . Number of children: 0  . Years of education: Not on file  . Highest education level: Not on file  Occupational History    Comment: retired since last March. Began working again last week at [employer] part time.  Social Needs  . Financial resource strain: Not on file  . Food insecurity    Worry: Not on file    Inability: Not on file  . Transportation needs    Medical: Not on file    Non-medical: Not on file  Tobacco Use  . Smoking status: Former Smoker    Packs/day: 1.50    Years: 11.00    Pack years: 16.50    Types: Cigarettes    Quit date: 01/23/1971    Years since quitting: 48.2  . Smokeless tobacco: Never Used  . Tobacco comment: cigars occasionally  Substance and Sexual Activity  . Alcohol use: Yes    Alcohol/week: 3.0 standard drinks    Types: 3 Standard drinks or equivalent per week    Comment: whiskey or bourbon twice a week   . Drug use: No  . Sexual activity: Not Currently  Lifestyle  . Physical activity    Days per week: Not on file    Minutes per session: Not on file  . Stress: Not on file  Relationships  . Social Herbalist on phone: Not on file    Gets together: Not on file    Attends religious service: Not on file    Active member of club or organization: Not on file    Attends meetings of clubs or organizations: Not on file    Relationship status: Not on file  . Intimate partner violence    Fear of current or ex partner: Not on file    Emotionally abused: Not on file    Physically abused: Not on file    Forced sexual activity: Not on file  Other Topics Concern  . Not on file  Social History Narrative  . Not on file    FAMILY HISTORY:   Family Status  Relation Name Status  . Mother  Alive  . Father  Deceased  at age 37  . Neg Hx  (Not Specified)    ROS:  Review of Systems  Constitutional:       Appetite loss with 5 lb weight loss  HENT: Negative.   Eyes: Negative.   Respiratory: Negative.   Cardiovascular: Negative.   Gastrointestinal: Negative.   Genitourinary: Positive for frequency and urgency.  Musculoskeletal: Positive for back pain.  Skin: Negative.   Endo/Heme/Allergies: Negative.  PHYSICAL EXAMINATION:    VITALS:   Vitals:   04/30/19 0845  BP: (!) 162/88  Pulse: 90  Resp: 16  Temp: 97.7 F (36.5 C)  SpO2: 95%  Weight: 199 lb 3.2 oz (90.4 kg)  Height: 5' 9.5" (1.765 m)    GEN:  The patient appears stated age and is in NAD. HEENT:  Normocephalic, atraumatic.  The mucous membranes are moist. The superficial temporal arteries are without ropiness or tenderness. CV:  RRR Lungs:  CTAB Neck/HEME:  There are no carotid bruits bilaterally.  Neurological examination:  Orientation: The patient is alert and oriented x3. Fund of knowledge is appropriate.  Recent and remote memory are intact.  Attention and concentration are normal.    Able to name objects and repeat phrases. Cranial nerves: There is good facial symmetry. Extraocular muscles are intact. The visual fields are full to confrontational testing. The speech is fluent and clear. Soft palate rises symmetrically and there is no tongue deviation. Hearing is intact to conversational tone. Sensation: Sensation is intact to light and pinprick throughout (facial, trunk, extremities). Pin is decreased in a stocking distribution bilaterally, R more than L.  Vibration is intact at the bilateral big toe but decreased. There is no extinction with double simultaneous stimulation. There is no sensory dermatomal level identified. Motor: Strength is 5/5 in the bilateral upper and lower extremities.   Shoulder shrug is equal and symmetric.  There is no pronator drift. Deep tendon reflexes: Deep tendon reflexes are 2/4 at the bilateral  biceps, triceps, brachioradialis, patella and absent at the bilateral achilles. Plantar response is upgoing on the right  Movement examination: Tone: There is normal tone in the UE/LE Abnormal movements: no rest tremor.  No postural tremor.  Mild intention tremor bilaterally.  Mild tremor with archimedes on the L Coordination:  There is no decremation with RAM's, with any form of RAMS, including alternating supination and pronation of the forearm, hand opening and closing, finger taps, heel taps and toe taps. Gait and Station: The patient has no difficulty arising out of a deep-seated chair without the use of the hands. The patient's stride length is good with wide based gait.  However, with ambulation, he does appear to have some foot drop on the right.  He is able to ambulate in a tandem fashion.  He is able to walk on his heels and toes.    Chemistry      Component Value Date/Time   NA 141 03/20/2019 0824   NA 142 12/17/2017 0934   K 3.6 03/20/2019 0824   CL 108 03/20/2019 0824   CO2 24 03/20/2019 0824   BUN 16 03/20/2019 0824   BUN 12 12/17/2017 0934   CREATININE 0.93 03/20/2019 0824   CREATININE 1.04 12/21/2016 0825      Component Value Date/Time   CALCIUM 8.6 (L) 03/20/2019 0824   ALKPHOS 72 03/20/2019 0824   AST 15 03/20/2019 0824   ALT 14 03/20/2019 0824   BILITOT 0.5 03/20/2019 0824   BILITOT 0.4 12/17/2017 0934     No results found for: TSH  No results found for: VITAMINB12  Lab Results  Component Value Date   WBC 4.1 03/20/2019   HGB 13.5 03/20/2019   HCT 40.7 03/20/2019   MCV 90.6 03/20/2019   PLT 273 03/20/2019    ASSESSMENT/PLAN:  1.  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  It was noticed by his wife.  Tremor is not bothersome to  the patient.  Patient wants no treatment.  Reassurance was provided that I saw no evidence of any neurodegenerative process that would cause tremor, such as Parkinson's disease.  We will go  ahead and proceed with TSH, given that he has not had that done, just to make sure that is not a contributing factor.   2.  Balance change  -Potentially multifactorial, but the patient does appear to have some right foot drop along with pain and paresthesias in the right leg.  We will do an MRI of the lumbar spine (also has back pain).  We will also do an EMG.  -He has some mild clinical evidence of a peripheral neuropathy.  Labs will be done, including B12, folate, RPR, SPEP/UPEP with immunofixation.    -He will have an MRI of the brain given falls.  3.  Migraine without headache  -long hx of this.  Has not changed since he was a child.  Reassurance provided.  4.  Follow-up will depend on the results of the above.  Cc:  Denita Lung, MD

## 2019-04-29 DIAGNOSIS — G603 Idiopathic progressive neuropathy: Secondary | ICD-10-CM | POA: Diagnosis not present

## 2019-04-29 DIAGNOSIS — R739 Hyperglycemia, unspecified: Secondary | ICD-10-CM | POA: Diagnosis not present

## 2019-04-29 DIAGNOSIS — R6889 Other general symptoms and signs: Secondary | ICD-10-CM | POA: Diagnosis not present

## 2019-04-29 DIAGNOSIS — R251 Tremor, unspecified: Secondary | ICD-10-CM | POA: Diagnosis not present

## 2019-04-30 ENCOUNTER — Encounter: Payer: Self-pay | Admitting: Neurology

## 2019-04-30 ENCOUNTER — Other Ambulatory Visit: Payer: Self-pay | Admitting: Neurology

## 2019-04-30 ENCOUNTER — Ambulatory Visit: Payer: PPO | Admitting: Neurology

## 2019-04-30 ENCOUNTER — Other Ambulatory Visit: Payer: Self-pay

## 2019-04-30 ENCOUNTER — Other Ambulatory Visit (INDEPENDENT_AMBULATORY_CARE_PROVIDER_SITE_OTHER): Payer: PPO

## 2019-04-30 VITALS — BP 162/88 | HR 90 | Temp 97.7°F | Resp 16 | Ht 69.5 in | Wt 199.2 lb

## 2019-04-30 DIAGNOSIS — G603 Idiopathic progressive neuropathy: Secondary | ICD-10-CM

## 2019-04-30 DIAGNOSIS — R6889 Other general symptoms and signs: Secondary | ICD-10-CM

## 2019-04-30 DIAGNOSIS — G3281 Cerebellar ataxia in diseases classified elsewhere: Secondary | ICD-10-CM

## 2019-04-30 DIAGNOSIS — M21371 Foot drop, right foot: Secondary | ICD-10-CM

## 2019-04-30 DIAGNOSIS — R251 Tremor, unspecified: Secondary | ICD-10-CM | POA: Diagnosis not present

## 2019-04-30 DIAGNOSIS — R739 Hyperglycemia, unspecified: Secondary | ICD-10-CM

## 2019-04-30 DIAGNOSIS — R270 Ataxia, unspecified: Secondary | ICD-10-CM

## 2019-04-30 NOTE — Patient Instructions (Addendum)
Your provider has requested that you have labwork completed today. Please go to [facility] ([id number]) on the second floor of this building before leaving the office today. You do not need to check in. If you are not called within 15 minutes please check with the front desk.   A referral to [facility] has been placed for your MRI someone will contact you directly to schedule your appt. They are located at [place]. Please contact them directly by calling [phone] with any questions regarding your referral.

## 2019-05-01 LAB — TSH: TSH: 2.3 mIU/L (ref 0.40–4.50)

## 2019-05-01 LAB — HEMOGLOBIN A1C
Hgb A1c MFr Bld: 5.5 % of total Hgb (ref <5.7)
Mean Plasma Glucose: 111 (calc)
eAG (mmol/L): 6.2 (calc)

## 2019-05-01 LAB — B12 AND FOLATE PANEL
Folate: 8.1 ng/mL
Vitamin B-12: 365 pg/mL (ref 200–1100)

## 2019-05-04 LAB — PROTEIN ELECTROPHORESIS,RANDOM URN
Albumin: 51 %
Alpha-1-Globulin, U: 6 %
Alpha-2-Globulin, U: 16 %
Beta Globulin, U: 15 %
Creatinine, Urine: 179 mg/dL
Gamma Globulin, U: 12 %
Protein/Creat Ratio: 134 mg/g creat
Protein/Creatinine Ratio: 0.134 mg/mg creat
Total Protein, Urine: 24 mg/dL

## 2019-05-05 LAB — FREE K+L LT CHAINS,QN,S
Ig Kappa Free Light Chain: 13 mg/L (ref 3.3–19.4)
Ig Lambda Free Light Chain: 11.5 mg/L (ref 5.7–26.3)
Kappa/Lambda FluidC Ratio: 1.13 (ref 0.26–1.65)

## 2019-05-05 LAB — MULTIPLE MYELOMA CASCADE WITH REFLEX TO SIFE AND SFLC
A/G Ratio: 1.5 (ref 0.7–1.7)
Albumin ELP: 3.8 g/dL (ref 2.9–4.4)
Alpha 1: 0.3 g/dL (ref 0.0–0.4)
Alpha 2: 0.8 g/dL (ref 0.4–1.0)
Beta: 0.8 g/dL (ref 0.7–1.3)
Gamma Globulin: 0.6 g/dL (ref 0.4–1.8)
Globulin, Total: 2.6 g/dL (ref 2.2–3.9)
Reflex Testing: 0
Total Protein: 6.4 g/dL (ref 6.0–8.5)

## 2019-05-05 LAB — COMMENT:

## 2019-05-07 ENCOUNTER — Other Ambulatory Visit: Payer: Self-pay

## 2019-05-07 ENCOUNTER — Telehealth: Payer: Self-pay | Admitting: Neurology

## 2019-05-07 DIAGNOSIS — R251 Tremor, unspecified: Secondary | ICD-10-CM

## 2019-05-07 DIAGNOSIS — R6889 Other general symptoms and signs: Secondary | ICD-10-CM

## 2019-05-07 DIAGNOSIS — G603 Idiopathic progressive neuropathy: Secondary | ICD-10-CM

## 2019-05-07 NOTE — Telephone Encounter (Signed)
Patient is aware 

## 2019-05-07 NOTE — Telephone Encounter (Signed)
Bernard Reed, can you call [facility].  I got the patient's UPEP back, which did not reveal any Bence Jones proteins, but his SPEP never came back.  Can you check on that.

## 2019-05-07 NOTE — Telephone Encounter (Signed)
Placed future lab and patient plans to get this drawn on the 10th after his appointment with Dr Posey Pronto

## 2019-05-07 NOTE — Telephone Encounter (Signed)
Yes, if he is able to do that.

## 2019-05-07 NOTE — Telephone Encounter (Signed)
Disregard the message earlier.  The remainder of those labs did come in today.

## 2019-05-07 NOTE — Telephone Encounter (Signed)
This will need to be a redraw, the lab did not run the test. Would you like the patient scheduled for a blood draw next week?

## 2019-05-11 ENCOUNTER — Ambulatory Visit
Admission: RE | Admit: 2019-05-11 | Discharge: 2019-05-11 | Disposition: A | Discharge status: Home / Self Care | Payer: PPO | Source: Ambulatory Visit | Attending: Radiation Oncology | Admitting: Radiation Oncology

## 2019-05-11 ENCOUNTER — Encounter: Payer: Self-pay | Admitting: Radiation Oncology

## 2019-05-11 DIAGNOSIS — C61 Malignant neoplasm of prostate: Secondary | ICD-10-CM | POA: Diagnosis not present

## 2019-05-12 ENCOUNTER — Other Ambulatory Visit: Payer: Self-pay

## 2019-05-12 ENCOUNTER — Ambulatory Visit (INDEPENDENT_AMBULATORY_CARE_PROVIDER_SITE_OTHER): Payer: PPO | Admitting: Neurology

## 2019-05-12 DIAGNOSIS — G603 Idiopathic progressive neuropathy: Secondary | ICD-10-CM

## 2019-05-12 NOTE — Procedures (Signed)
[facility]  [place], [address]  [place], [place] [address] Tel: [phone] Fax:  [phone] Test Date:  05/12/2019  Patient: Bernard Reed DOB: [birth year] Physician: Narda Amber, DO  Sex: Male Height: 5\' 10"  Ref Phys: Alonza Bogus, DO  ID#: [id number] Temp: 32.0C Technician:    Patient Complaints: This is a 73 year old man referred for evaluation of right leg paresthesias and weakness.  NCV & EMG Findings: Electrodiagnostic testing of the right lower extremity and additional studies of the left shows: 1. Bilateral sural and superficial peroneal sensory responses are within normal limits. 2. Bilateral peroneal and tibial motor responses are within normal limits. 3. Bilateral tibial H reflex studies are within normal limits. 4. There is no evidence of active or chronic motor axonal changes affecting any of the tested muscles.  Motor unit configuration and recruitment pattern is within normal limits.  Impression: This is a normal study of the lower extremities.  In particular, there is no evidence of a sensorimotor polyneuropathy or lumbosacral radiculopathy.     ___________________________ Narda Amber, DO    Nerve Conduction Studies Anti Sensory Summary Table   Site NR Peak (ms) Norm Peak (ms) P-T Amp (V) Norm P-T Amp  Left Sup Peroneal Anti Sensory (Ant Lat Mall)  32C  12 cm    2.2 <4.6 9.1 >3  Right Sup Peroneal Anti Sensory (Ant Lat Mall)  32C  12 cm    2.7 <4.6 7.4 >3  Left Sural Anti Sensory (Lat Mall)  32C  Calf    4.0 <4.6 11.5 >3  Right Sural Anti Sensory (Lat Mall)  32C  Calf    3.6 <4.6 14.7 >3   Motor Summary Table   Site NR Onset (ms) Norm Onset (ms) O-P Amp (mV) Norm O-P Amp Site1 Site2 Delta-0 (ms) Dist (cm) Vel (m/s) Norm Vel (m/s)  Left Peroneal Motor (Ext Dig Brev)  32C  Ankle    3.6 <6.0 6.9 >2.5 B Fib Ankle 8.4 36.0 43 >40  B Fib    12.0  4.7  Poplt B Fib 1.4 7.0 50 >40  Poplt    13.4  4.5         Right Peroneal Motor  (Ext Dig Brev)  32C  Ankle    3.2 <6.0 8.7 >2.5 B Fib Ankle 8.2 37.0 45 >40  B Fib    11.4  7.7  Poplt B Fib 1.5 9.0 60 >40  Poplt    12.9  7.5         Left Peroneal TA Motor (Tib Ant)  32C  Fib Head    2.7 <4.5 6.8 >3 Poplit Fib Head 1.4 8.0 57 >40  Poplit    4.1  6.8         Right Peroneal TA Motor (Tib Ant)  32C  Fib Head    2.6 <4.5 6.1 >3 Poplit Fib Head 1.5 9.0 60 >40  Poplit    4.1  6.0         Left Tibial Motor (Abd Hall Brev)  32C  Ankle    5.2 <6.0 10.3 >4 Knee Ankle 8.9 42.0 47 >40  Knee    14.1  7.7         Right Tibial Motor (Abd Hall Brev)  32C  Ankle    3.3 <6.0 8.6 >4 Knee Ankle 9.4 41.0 44 >40  Knee    12.7  6.7          H Reflex Studies  NR H-Lat (ms) Lat Norm (ms) L-R H-Lat (ms)  Left Tibial (Gastroc)  32C     34.01 <35 0.00  Right Tibial (Gastroc)  32C     34.01 <35 0.00   EMG   Side Muscle Ins Act Fibs Psw Fasc Number Recrt Dur Dur. Amp Amp. Poly Poly. Comment  Right AntTibialis Nml Nml Nml Nml Nml Nml Nml Nml Nml Nml Nml Nml N/A  Right Gastroc Nml Nml Nml Nml Nml Nml Nml Nml Nml Nml Nml Nml N/A  Right Flex Dig Long Nml Nml Nml Nml Nml Nml Nml Nml Nml Nml Nml Nml N/A  Right RectFemoris Nml Nml Nml Nml Nml Nml Nml Nml Nml Nml Nml Nml N/A  Right GluteusMed Nml Nml Nml Nml Nml Nml Nml Nml Nml Nml Nml Nml N/A  Left AntTibialis Nml Nml Nml Nml Nml Nml Nml Nml Nml Nml Nml Nml N/A  Left Gastroc Nml Nml Nml Nml Nml Nml Nml Nml Nml Nml Nml Nml N/A  Left RectFemoris Nml Nml Nml Nml Nml Nml Nml Nml Nml Nml Nml Nml N/A  Left GluteusMed Nml Nml Nml Nml Nml Nml Nml Nml Nml Nml Nml Nml N/A      Waveforms:

## 2019-05-16 NOTE — Progress Notes (Signed)
  Radiation Oncology         [phone]) [phone] ________________________________  Name: Bernard Reed MRN: [id number]  Date: 05/11/2019  DOB: [birth year]  3D Planning Note   Prostate Brachytherapy Post-Implant Dosimetry  Diagnosis: 73 y.o. gentleman with Stage T1c adenocarcinoma of the prostate with Gleason score of 3+4, and PSA of 11.4 ( 22.8 adjusted for finasteride).   Narrative: On a previous date, Bernard Reed returned following prostate seed implantation for post implant planning. He underwent CT scan complex simulation to delineate the three-dimensional structures of the pelvis and demonstrate the radiation distribution.  Since that time, the seed localization, and complex isodose planning with dose volume histograms have now been completed.  Results:   Prostate Coverage - The dose of radiation delivered to the 90% or more of the prostate gland (D90) was 109.05% of the prescription dose. This exceeds our goal of greater than 90%. Rectal Sparing - The volume of rectal tissue receiving the prescription dose or higher was 0.12 cc. This falls under our thresholds tolerance of 1.0 cc.  Impression: The prostate seed implant appears to show adequate target coverage and appropriate rectal sparing.  Plan:  The patient will continue to follow with urology for ongoing PSA determinations. I would anticipate a high likelihood for local tumor control with minimal risk for rectal morbidity.  ________________________________  Sheral Apley Tammi Klippel, M.D.

## 2019-05-20 ENCOUNTER — Other Ambulatory Visit: Payer: Self-pay | Admitting: Family Medicine

## 2019-05-20 DIAGNOSIS — B351 Tinea unguium: Secondary | ICD-10-CM

## 2019-05-20 NOTE — Telephone Encounter (Signed)
[facility]  Please advise [facility]

## 2019-05-26 ENCOUNTER — Encounter: Payer: PPO | Admitting: Neurology

## 2019-05-27 ENCOUNTER — Ambulatory Visit
Admission: RE | Admit: 2019-05-27 | Discharge: 2019-05-27 | Disposition: A | Discharge status: Home / Self Care | Payer: PPO | Source: Ambulatory Visit | Attending: Neurology | Admitting: Neurology

## 2019-05-27 DIAGNOSIS — R270 Ataxia, unspecified: Secondary | ICD-10-CM

## 2019-05-27 DIAGNOSIS — R251 Tremor, unspecified: Secondary | ICD-10-CM

## 2019-05-27 DIAGNOSIS — R739 Hyperglycemia, unspecified: Secondary | ICD-10-CM

## 2019-05-27 DIAGNOSIS — G3281 Cerebellar ataxia in diseases classified elsewhere: Secondary | ICD-10-CM

## 2019-05-27 DIAGNOSIS — G603 Idiopathic progressive neuropathy: Secondary | ICD-10-CM

## 2019-05-27 DIAGNOSIS — M21371 Foot drop, right foot: Secondary | ICD-10-CM

## 2019-05-27 DIAGNOSIS — R6889 Other general symptoms and signs: Secondary | ICD-10-CM

## 2019-05-27 DIAGNOSIS — M48061 Spinal stenosis, lumbar region without neurogenic claudication: Secondary | ICD-10-CM | POA: Diagnosis not present

## 2019-07-06 DIAGNOSIS — C61 Malignant neoplasm of prostate: Secondary | ICD-10-CM | POA: Diagnosis not present

## 2019-07-20 DIAGNOSIS — Z8546 Personal history of malignant neoplasm of prostate: Secondary | ICD-10-CM | POA: Diagnosis not present

## 2019-07-20 DIAGNOSIS — R3915 Urgency of urination: Secondary | ICD-10-CM | POA: Diagnosis not present

## 2019-07-20 DIAGNOSIS — R351 Nocturia: Secondary | ICD-10-CM | POA: Diagnosis not present

## 2019-08-31 ENCOUNTER — Ambulatory Visit: Payer: PPO | Attending: Internal Medicine

## 2019-08-31 DIAGNOSIS — Z23 Encounter for immunization: Secondary | ICD-10-CM | POA: Insufficient documentation

## 2019-08-31 NOTE — Progress Notes (Signed)
   Covid-19 Vaccination Clinic  Name:  Jance Packard    MRN: [id number] DOB: 05-04-1946  08/31/2019  Mr. Fettes was observed post Covid-19 immunization for 15 minutes without incidence. He was provided with Vaccine Information Sheet and instruction to access the V-Safe system.   Mr. Agreda was instructed to call 911 with any severe reactions post vaccine: Marland Kitchen Difficulty breathing  . Swelling of your face and throat  . A fast heartbeat  . A bad rash all over your body  . Dizziness and weakness    Immunizations Administered    Name Date Dose VIS Date Route   Pfizer COVID-19 Vaccine 08/31/2019  8:29 AM 0.3 mL 06/12/2019 Intramuscular   Manufacturer: [place]   Lot: [id number]   [place]: [id number]

## 2019-09-16 ENCOUNTER — Other Ambulatory Visit: Payer: Self-pay | Admitting: Family Medicine

## 2019-09-16 DIAGNOSIS — B351 Tinea unguium: Secondary | ICD-10-CM

## 2019-09-17 NOTE — Telephone Encounter (Signed)
Is this okay to refill? 

## 2019-09-21 ENCOUNTER — Telehealth: Payer: Self-pay | Admitting: Neurology

## 2019-09-21 DIAGNOSIS — R202 Paresthesia of skin: Secondary | ICD-10-CM

## 2019-09-21 DIAGNOSIS — M5416 Radiculopathy, lumbar region: Secondary | ICD-10-CM

## 2019-09-21 NOTE — Telephone Encounter (Signed)
We can send him to Dr. Maryjean Ka to see if there is anything that he can do (he is at [facility]).  Dx:  lumbar radiculopathy, R leg paresthesia

## 2019-09-21 NOTE — Telephone Encounter (Signed)
Left detailed message on patients voicemail advising him that Dr Tat has placed a referral for him to go to [facility] for his leg. Advised patient that the office would contact him to schedule an appt and to contact office with any questions.   Referral has been placed.

## 2019-09-21 NOTE — Telephone Encounter (Signed)
Patient called with questions about possibly getting an injection in his leg for numbness.

## 2019-09-28 ENCOUNTER — Encounter: Payer: Self-pay | Admitting: Family Medicine

## 2019-09-28 ENCOUNTER — Other Ambulatory Visit: Payer: Self-pay

## 2019-09-28 ENCOUNTER — Telehealth: Payer: Self-pay | Admitting: Neurology

## 2019-09-28 ENCOUNTER — Ambulatory Visit: Payer: PPO | Admitting: Family Medicine

## 2019-09-28 VITALS — BP 120/76 | HR 74 | Temp 97.5°F | Wt 200.8 lb

## 2019-09-28 DIAGNOSIS — B351 Tinea unguium: Secondary | ICD-10-CM

## 2019-09-28 DIAGNOSIS — K120 Recurrent oral aphthae: Secondary | ICD-10-CM

## 2019-09-28 DIAGNOSIS — Z79899 Other long term (current) drug therapy: Secondary | ICD-10-CM | POA: Diagnosis not present

## 2019-09-28 DIAGNOSIS — C61 Malignant neoplasm of prostate: Secondary | ICD-10-CM | POA: Diagnosis not present

## 2019-09-28 DIAGNOSIS — E785 Hyperlipidemia, unspecified: Secondary | ICD-10-CM

## 2019-09-28 DIAGNOSIS — I2510 Atherosclerotic heart disease of native coronary artery without angina pectoris: Secondary | ICD-10-CM | POA: Diagnosis not present

## 2019-09-28 NOTE — Telephone Encounter (Signed)
Spoke with [facility]. They never received referral.  New referral sent.  Patient informed.

## 2019-09-28 NOTE — Progress Notes (Signed)
   Subjective:    Patient ID: Bernard Reed, male    DOB: 09-Jun-1946, 74 y.o.   MRN: [id number]  HPI He is here for follow-up visit.  He does need blood work to follow-up on his Lamisil dosing.  He also has a lesion on the underside of his tongue that he would like evaluated.  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  Recent MRI did show some disc protrusion and he will be evaluated in the near future by neurosurgery for this.  He has had seeds for prostate cancer and seems to be doing well with that.  Presently he is taking Flomax but they have stopped the finasteride.   Review of Systems     Objective:   Physical Exam Alert and in no distress.  Exam of the tongue near the frenulum does show an aphthous ulcer on the right.  Exam of his feet does show clearing of the nails proximally at least halfway.       Assessment & Plan:  Onychomycosis - Plan: Comprehensive metabolic panel, CBC with Differential/Platelet  Prostate cancer ([place])  Hyperlipidemia, unspecified hyperlipidemia type  Aphthous ulcer of mouth  Encounter for long-term (current) use of medications - Plan: Comprehensive metabolic panel, CBC with Differential/Platelet He will continue to be followed by urology for his prostate cancer.  Continue on his other medications. Explained the diagnosis of aphthous ulcer to him.  Conservative care is appropriate.  He expressed understanding of all this

## 2019-09-28 NOTE — Telephone Encounter (Signed)
Patient called requesting help to scheduling a neurosurgery referral. He said he called [facility] and was told our office needed to reach out to them first.

## 2019-09-29 ENCOUNTER — Ambulatory Visit: Payer: PPO | Attending: Internal Medicine

## 2019-09-29 DIAGNOSIS — Z23 Encounter for immunization: Secondary | ICD-10-CM

## 2019-09-29 LAB — CBC WITH DIFFERENTIAL/PLATELET
Basophils Absolute: 0 10*3/uL (ref 0.0–0.2)
Basos: 1 %
EOS (ABSOLUTE): 0.1 10*3/uL (ref 0.0–0.4)
Eos: 1 %
Hematocrit: 39.6 % (ref 37.5–51.0)
Hemoglobin: 13.3 g/dL (ref 13.0–17.7)
Immature Grans (Abs): 0 10*3/uL (ref 0.0–0.1)
Immature Granulocytes: 0 %
Lymphocytes Absolute: 0.6 10*3/uL — ABNORMAL LOW (ref 0.7–3.1)
Lymphs: 15 %
MCH: 30.2 pg (ref 26.6–33.0)
MCHC: 33.6 g/dL (ref 31.5–35.7)
MCV: 90 fL (ref 79–97)
Monocytes Absolute: 0.3 10*3/uL (ref 0.1–0.9)
Monocytes: 7 %
Neutrophils Absolute: 2.9 10*3/uL (ref 1.4–7.0)
Neutrophils: 76 %
Platelets: 236 10*3/uL (ref 150–450)
RBC: 4.41 x10E6/uL (ref 4.14–5.80)
RDW: 13.4 % (ref 11.6–15.4)
WBC: 3.8 10*3/uL (ref 3.4–10.8)

## 2019-09-29 LAB — COMPREHENSIVE METABOLIC PANEL
ALT: 10 IU/L (ref 0–44)
AST: 15 IU/L (ref 0–40)
Albumin/Globulin Ratio: 2.6 — ABNORMAL HIGH (ref 1.2–2.2)
Albumin: 4.7 g/dL (ref 3.7–4.7)
Alkaline Phosphatase: 81 IU/L (ref 39–117)
BUN/Creatinine Ratio: 13 (ref 10–24)
BUN: 12 mg/dL (ref 8–27)
Bilirubin Total: 0.4 mg/dL (ref 0.0–1.2)
CO2: 22 mmol/L (ref 20–29)
Calcium: 9 mg/dL (ref 8.6–10.2)
Chloride: 103 mmol/L (ref 96–106)
Creatinine, Ser: 0.95 mg/dL (ref 0.76–1.27)
GFR calc Af Amer: 91 mL/min/{1.73_m2} (ref >59)
GFR calc non Af Amer: 79 mL/min/{1.73_m2} (ref >59)
Globulin, Total: 1.8 g/dL (ref 1.5–4.5)
Glucose: 96 mg/dL (ref 65–99)
Potassium: 3.8 mmol/L (ref 3.5–5.2)
Sodium: 140 mmol/L (ref 134–144)
Total Protein: 6.5 g/dL (ref 6.0–8.5)

## 2019-09-29 NOTE — Progress Notes (Signed)
   Covid-19 Vaccination Clinic  Name:  Bernard Reed    MRN: [id number] DOB: 1946/06/21  09/29/2019  Bernard Reed was observed post Covid-19 immunization for 15 minutes without incident. He was provided with Vaccine Information Sheet and instruction to access the V-Safe system.   Bernard Reed was instructed to call 911 with any severe reactions post vaccine: Marland Kitchen Difficulty breathing  . Swelling of face and throat  . A fast heartbeat  . A bad rash all over body  . Dizziness and weakness   Immunizations Administered    Name Date Dose VIS Date Route   Pfizer COVID-19 Vaccine 09/29/2019  8:41 AM 0.3 mL 06/12/2019 Intramuscular   Manufacturer: [place]   Lot: [id number]   [place]: [id number]

## 2019-10-14 ENCOUNTER — Other Ambulatory Visit: Payer: Self-pay | Admitting: Family Medicine

## 2019-10-14 DIAGNOSIS — B351 Tinea unguium: Secondary | ICD-10-CM

## 2019-10-14 NOTE — Telephone Encounter (Signed)
[facility]QUEST[facility] TO [facility] TERBINAFINE. pLEASE ADVISE [facility]

## 2019-10-19 ENCOUNTER — Telehealth: Payer: Self-pay

## 2019-10-19 NOTE — Telephone Encounter (Signed)
Received fax from [facility] that stated patient has an appointment with Dr Baird Kay on 10/28/2019 at 3:30pm.   Dr Tat has been made aware.

## 2019-10-28 DIAGNOSIS — R251 Tremor, unspecified: Secondary | ICD-10-CM | POA: Diagnosis not present

## 2019-10-28 DIAGNOSIS — M5126 Other intervertebral disc displacement, lumbar region: Secondary | ICD-10-CM | POA: Diagnosis not present

## 2019-10-28 DIAGNOSIS — Z8546 Personal history of malignant neoplasm of prostate: Secondary | ICD-10-CM | POA: Diagnosis not present

## 2019-10-28 DIAGNOSIS — M5416 Radiculopathy, lumbar region: Secondary | ICD-10-CM | POA: Diagnosis not present

## 2019-10-28 DIAGNOSIS — M545 Low back pain: Secondary | ICD-10-CM | POA: Diagnosis not present

## 2019-11-13 DIAGNOSIS — Z8546 Personal history of malignant neoplasm of prostate: Secondary | ICD-10-CM | POA: Diagnosis not present

## 2019-11-13 DIAGNOSIS — R351 Nocturia: Secondary | ICD-10-CM | POA: Diagnosis not present

## 2019-11-13 DIAGNOSIS — R3915 Urgency of urination: Secondary | ICD-10-CM | POA: Diagnosis not present

## 2020-01-05 DIAGNOSIS — M4726 Other spondylosis with radiculopathy, lumbar region: Secondary | ICD-10-CM | POA: Diagnosis not present

## 2020-01-05 DIAGNOSIS — M5126 Other intervertebral disc displacement, lumbar region: Secondary | ICD-10-CM | POA: Diagnosis not present

## 2020-01-05 DIAGNOSIS — M5116 Intervertebral disc disorders with radiculopathy, lumbar region: Secondary | ICD-10-CM | POA: Diagnosis not present

## 2020-01-12 ENCOUNTER — Other Ambulatory Visit: Payer: Self-pay | Admitting: Family Medicine

## 2020-01-12 DIAGNOSIS — B351 Tinea unguium: Secondary | ICD-10-CM

## 2020-01-12 NOTE — Telephone Encounter (Signed)
Needs to be seen and have liver function checked before continuing on medication.

## 2020-01-12 NOTE — Telephone Encounter (Signed)
Pt coming in next Tuesday 7/20

## 2020-01-12 NOTE — Telephone Encounter (Signed)
Is this okay to refill? 

## 2020-01-19 ENCOUNTER — Ambulatory Visit: Payer: PPO | Admitting: Family Medicine

## 2020-01-26 ENCOUNTER — Other Ambulatory Visit: Payer: Self-pay

## 2020-01-26 ENCOUNTER — Ambulatory Visit (INDEPENDENT_AMBULATORY_CARE_PROVIDER_SITE_OTHER): Payer: PPO | Admitting: Family Medicine

## 2020-01-26 ENCOUNTER — Encounter: Payer: Self-pay | Admitting: Family Medicine

## 2020-01-26 VITALS — BP 110/68 | HR 92 | Temp 97.6°F | Ht 69.0 in | Wt 195.6 lb

## 2020-01-26 DIAGNOSIS — B351 Tinea unguium: Secondary | ICD-10-CM

## 2020-01-26 DIAGNOSIS — C61 Malignant neoplasm of prostate: Secondary | ICD-10-CM | POA: Diagnosis not present

## 2020-01-26 DIAGNOSIS — Z9889 Other specified postprocedural states: Secondary | ICD-10-CM | POA: Diagnosis not present

## 2020-01-26 MED ORDER — TERBINAFINE HCL 250 MG PO TABS: 250.0000 mg | ORAL_TABLET | Freq: Every day | ORAL | 0 refills | Status: DC

## 2020-01-26 NOTE — Progress Notes (Signed)
° °  Subjective:    Patient ID: Bernard Reed, male    DOB: 10-Feb-1946, 74 y.o.   MRN: [id number]  HPI He is here for follow-up on onychomycosis.  He has been on Lamisil since February and doing well on that.  He had back surgery approximately 2 weeks ago and seems to be recovering nicely from that.  It apparently was for an L5 radiculopathy.  He also has had seeds placed and apparently his last PSA was undetectable.   Review of Systems     Objective:   Physical Exam Alert and in no distress.  Good dorsiflexion noted of his feet.  The nails show at least 50% improvement in the nail.      Assessment & Plan:  Onychomycosis - Plan: Comprehensive metabolic panel, terbinafine (LAMISIL) 250 MG tablet  Prostate cancer ([place])  S/P lumbar discectomy He will continue to be followed by neurosurgery and urology. I will give him another 90 days of Lamisil.  Explained that even though it looks like he is cured, this will most likely return.

## 2020-01-27 LAB — COMPREHENSIVE METABOLIC PANEL
ALT: 11 IU/L (ref 0–44)
AST: 9 IU/L (ref 0–40)
Albumin/Globulin Ratio: 2 (ref 1.2–2.2)
Albumin: 4.6 g/dL (ref 3.7–4.7)
Alkaline Phosphatase: 110 IU/L (ref 48–121)
BUN/Creatinine Ratio: 12 (ref 10–24)
BUN: 13 mg/dL (ref 8–27)
Bilirubin Total: 0.3 mg/dL (ref 0.0–1.2)
CO2: 24 mmol/L (ref 20–29)
Calcium: 9.1 mg/dL (ref 8.6–10.2)
Chloride: 105 mmol/L (ref 96–106)
Creatinine, Ser: 1.11 mg/dL (ref 0.76–1.27)
GFR calc Af Amer: 75 mL/min/{1.73_m2} (ref >59)
GFR calc non Af Amer: 65 mL/min/{1.73_m2} (ref >59)
Globulin, Total: 2.3 g/dL (ref 1.5–4.5)
Glucose: 105 mg/dL — ABNORMAL HIGH (ref 65–99)
Potassium: 4.1 mmol/L (ref 3.5–5.2)
Sodium: 142 mmol/L (ref 134–144)
Total Protein: 6.9 g/dL (ref 6.0–8.5)

## 2020-02-15 ENCOUNTER — Other Ambulatory Visit: Payer: Self-pay

## 2020-02-15 ENCOUNTER — Ambulatory Visit: Payer: PPO | Attending: Neurosurgery | Admitting: Physical Therapy

## 2020-02-15 DIAGNOSIS — R2681 Unsteadiness on feet: Secondary | ICD-10-CM | POA: Diagnosis not present

## 2020-02-15 DIAGNOSIS — M6281 Muscle weakness (generalized): Secondary | ICD-10-CM

## 2020-02-15 DIAGNOSIS — R2689 Other abnormalities of gait and mobility: Secondary | ICD-10-CM | POA: Diagnosis not present

## 2020-02-15 NOTE — Patient Instructions (Addendum)
Access Code: [email or web address] URL: [email or web address] Date: 02/15/2020 Prepared by: Mady Haagensen  Exercises Sit to Stand with Armchair - 1 x daily - 7 x weekly - 3 sets - 10 reps Single Leg Stance - 1-2 x daily - 7 x weekly - 1 sets - 3 reps - 10 sec hold

## 2020-02-16 NOTE — Therapy (Signed)
[facility] [address] [place], [place], [address] Phone: [phone]   Fax:  [phone]  Physical Therapy Evaluation  Patient Details  Name: Bernard Reed MRN: [id number] Date of Birth: 03-15-1946 Referring Provider (PT): Erline Levine, MD   Encounter Date: 02/15/2020   PT End of Session - 02/15/20 1207    Visit Number 1    Number of Visits 16    Date for PT Re-Evaluation 93/57/01   90 day cert for 8-wk POC (due to pt being out of town several times)   Authorization Type [employer]    Progress Note Due on Visit 10    PT Start Time 0821    PT Stop Time 0928    PT Time Calculation (min) 67 min    Equipment Utilized During Treatment Gait belt    Activity Tolerance Patient tolerated treatment well    Behavior During Therapy [facility] for tasks assessed/performed           Past Medical History:  Diagnosis Date  . Coronary artery disease    NONOBSTRUCTIVE  . Gallbladder disease   . GERD (gastroesophageal reflux disease)   . Hyperlipidemia   . Migraine aura without headache   . Prostate cancer ([place])   . Seasonal allergies     Past Surgical History:  Procedure Laterality Date  . CARDIAC CATHETERIZATION  07/16/2006   EF 50-55%  . CARDIOVASCULAR STRESS TEST  07/08/2006   EF 57%  . CATARACT EXTRACTION, BILATERAL    . COLONOSCOPY  09/14/2008  . CYSTOSCOPY N/A 03/25/2019   Procedure: CYSTOSCOPY;  Surgeon: Ardis Hughs, MD;  Location: [facility];  Service: Urology;  Laterality: N/A;  No seeds detected in bladder per Dr. Louis Meckel  . LAPAROSCOPIC CHOLECYSTECTOMY  2008  . PROSTATE BIOPSY  2009  . PROSTATE BIOPSY  06/27/2012  . PROSTATE BIOPSY  06/08/2013  . PROSTATE BIOPSY  03/19/2016  . PROSTATE BIOPSY  04/17/2018  . RADIOACTIVE SEED IMPLANT N/A 03/25/2019   Procedure: RADIOACTIVE SEED IMPLANT/BRACHYTHERAPY IMPLANT;  Surgeon: Ardis Hughs, MD;  Location: [facility];   Service: Urology;  Laterality: N/A;  . SPACE OAR INSTILLATION N/A 03/25/2019   Procedure: SPACE OAR INSTILLATION;  Surgeon: Ardis Hughs, MD;  Location: [facility];  Service: Urology;  Laterality: N/A;    There were no vitals filed for this visit.    Subjective Assessment - 02/15/20 0826    Subjective Balance issues started a few years ago; feel like this was going on prior to the back surgery.  Had back surgery 01/05/2020, having R foot drop.  Feel like the balance is getting worse that it was.  On flat ground, I'm fine; in the yard, sometimes I need the cane for balance.  Have had at least 5 falls in the past 6 months (none since the surgery).  One fall in the yard pulling weeds, another bad fall was going in back door and RLE slipped and fell back (and down the steps).  Using Hurrycane, tripod cane.    Pertinent History L4-5 microdisckectomy 01/05/2020; radiculopathy lumbar region, R foot drop, LBP with sciatica, lumbar herniated disk    Patient Stated Goals To get my balance back is my primary goal.    Currently in Pain? No/denies   Occasional discomfort in back             [facility] PT Assessment - 02/15/20 0833      Assessment   Medical Diagnosis  balance disorder    Referring Provider (PT) Erline Levine, MD    Onset Date/Surgical Date 01/05/20   surgery date; referral 02/08/2020     Precautions   Precautions Fall    Precaution Comments Per Dr. Vertell Limber:  "I do not want him to undergo PT for his low back as this is healing and will need more time."  Avoid bending, lifting, twisting      Balance Screen   Has the patient fallen in the past 6 months Yes    How many times? 5    Has the patient had a decrease in activity level because of a fear of falling?  Yes    Is the patient reluctant to leave their home because of a fear of falling?  No      Home Ecologist residence    Living Arrangements Spouse/significant other    Available Help at  Discharge Family    Type of [place] to enter    Entrance Stairs-Number of Steps 6-7    Entrance Stairs-Rails Right    Home Layout Multi-level    Alternate Level Stairs-Number of Steps 4   6-7 steps kitchen to driveway and into [employer] - single point    Additional Comments *No rail from bedroom to living area      Prior Function   Level of [place] Retired    Leisure Enjoys working in yard, (Chiropractor, riding Therapist, music, [employer])      Observation/Other Assessments   Focus on Therapeutic Outcomes (FOTO)  NA      Sensation   Light Touch Appears Intact    Additional Comments Pt notes increased sensation changes later in the day.      ROM / Strength   AROM / PROM / Strength Strength;AROM      AROM   Overall AROM  Deficits    AROM Assessment Site Ankle    Right/Left Ankle Right    Right Ankle Dorsiflexion 11      Strength   Overall Strength Deficits    Overall Strength Comments Decreased funtional strength noted with R foot slap with gait    Strength Assessment Site Hip;Knee;Ankle    Right/Left Hip Right;Left    Right Hip Flexion 4/5    Left Hip Flexion 5/5    Right/Left Knee Right;Left    Right Knee Flexion 4/5    Right Knee Extension 4/5    Left Knee Flexion 5/5    Left Knee Extension 5/5    Right/Left Ankle Right;Left    Right Ankle Dorsiflexion 4/5    Right Ankle Plantar Flexion 3+/5    Left Ankle Dorsiflexion 4/5      Transfers   Transfers Sit to Stand;Stand to Sit    Sit to Stand 5: Supervision;With upper extremity assist;From chair/3-in-1    Sit to Stand Details (indicate cue type and reason) Pt tends to push through posterior aspect of legs,with decreased forward lean, creating posterior loss of balance x 2 with sit<>stand in eval    Stand to Sit 5: Supervision;With upper extremity assist;To chair/3-in-1      Ambulation/Gait   Ambulation/Gait Yes    Ambulation/Gait Assistance 5:  Supervision    Ambulation/Gait Assistance Details Pt demonstrates ataxic, discoordinated movement of RLE at times (almost like a clonus-type movement with intention for curb negotiation and when placing R foot properly for transfers)  Ambulation Distance (Feet) 150 Feet    Assistive device Straight cane   Hurrycane/tripod cane   Gait Pattern Step-through pattern;Decreased step length - left;Decreased stance time - right;Decreased step length - right;Decreased dorsiflexion - right;Decreased weight shift to right;Ataxic;Poor foot clearance - right;Wide base of support    Ambulation Surface Level;Indoor    Gait velocity 11.54 sec = 2.84 ft/sec     Stairs Yes    Stairs Assistance 5: Supervision;4: Min guard    Stair Management Technique Two rails;Alternating pattern;Forwards   Slowed pace   Number of Stairs 4    Height of Stairs 6    Curb 4: Min assist    Curb Details (indicate cue type and reason) Gait training for curb, as pt asks several times during PT eval.  Performed curb negotiation x 2 reps:  Instructed pt for cane placement, proper foot sequence.  When attempting to descend curb, pt uses LLE to descend first.  When trialing bringing R foot forward to descend first, clonic/tremor/ataxic R foot motion noted and pt places R foot back on curb to descend with LLE first.    Gait Comments Pt tends to carry cane with gait during session.  Instructed pt in cane sequence and benefits of use of cane.      Standardized Balance Assessment   Standardized Balance Assessment Dynamic Gait Index;Timed Up and Go Test      Dynamic Gait Index   Level Surface Mild Impairment   7.87   Change in Gait Speed Moderate Impairment    Gait with Horizontal Head Turns Moderate Impairment   >9.8 sec   Gait with Vertical Head Turns Moderate Impairment   9.09   Gait and Pivot Turn Mild Impairment    Step Over Obstacle Severe Impairment    Step Around Obstacles Mild Impairment    Steps Mild Impairment    Total Score  11    DGI comment: Scores <19/24 indicate increased fall risk.      Timed Up and Go Test   Normal TUG (seconds) 15.25    TUG Comments Scores >13.5 sec indicate increased fall risk.      High Level Balance   High Level Balance Comments SLS:  LLE 2.04 sec, RLE <1 sec; standing on solid surface EO and EC 30 seconds, then on foam EO and EC 30 seconds with increased sway.                      Objective measurements completed on examination: See above findings.        Therapeutic Exercise-Performed the following HEP and initiated HEP:   Access Code: [email or web address] URL: [email or web address] Date: 02/15/2020 Prepared by: Mady Haagensen  Exercises Sit to Stand with Armchair - 1 x daily - 7 x weekly - 3 sets - 10 reps   -PT provides cues for positioning, foot placement, gentle forward lean to avoid posterior pull and LOB/to avoid pushing with backs of legs on chair  -Performed x 5 reps in session today Single Leg Stance - 1-2 x daily - 7 x weekly - 1 sets - 3 reps - 10 sec hold  -PTprovides cues for hand placement and posture.    -Performed x 3 reps, 10 sec, in session today         PT Education - 02/15/20 1206    Education Details Initiated HEP (see instructions); cane sequence and curb negotiation; Eval results and POC    Person(s) Educated Patient    Methods Explanation;Demonstration;Handout  Comprehension Verbalized understanding;Returned demonstration            PT Short Term Goals - 02/15/20 2036      PT SHORT TERM GOAL #1   Title Pt will be independent with HEP for improved strength, balance, transfers, and gait.  TARGET 03/11/2020    Time 4    Period Weeks    Status New      PT SHORT TERM GOAL #2   Title Pt will improve TUG score to less than or equal to 13.5 seconds for decreased fall risk.    Time 4    Period Weeks    Status New      PT SHORT TERM GOAL #3   Title Pt will improve DGI score to at least 14/24 for decreased fall risk.     Time 4    Period Weeks    Status New      PT SHORT TERM GOAL #4   Title Pt will negotiate 4 steps, step through pattern, modified independently, for improved stair negotiation in the home.    Time 4    Period Weeks    Status New      PT SHORT TERM GOAL #5   Title Pt will improve SLS to at least 2 seconds on RLE for improved stair, obstacle negotiation.    Time 4    Period Weeks    Status New             PT Long Term Goals - 02/16/20 1017      PT LONG TERM GOAL #1   Title Pt will be independent with progression of HEP for improved balance, strength, and gait.  TARGET 04/08/2020    Time 8    Period Weeks    Status New      PT LONG TERM GOAL #2   Title Pt will perform at least 5 reps of sit<>stand with minimal to no UE support, no posterior lean or loss of balance.    Time 8    Period Weeks    Status New      PT LONG TERM GOAL #3   Title Pt will improve DGI score to at least 19/24 for decreased fall risk.    Time 8    Period Weeks    Status New      PT LONG TERM GOAL #4   Title Pt will negotiate curb step, modified independently, for improved outdoor negotiation.    Time 8    Period Weeks    Status New      PT LONG TERM GOAL #5   Title Pt will ambulate at least 1000 ft, indoor and outdoor surfaces, modifiied independently, with cane (vs no device), for improved outdoor and community gait.    Time 8    Period Weeks    Status New                  Plan - 02/15/20 1208    Clinical Impression Statement Pt is a 74 year old male who presents to OPPT with history of balance deficits >1 year, with recent L4-5 microdiskectomy 01/05/2020.  Pt presents with decreased strength and ROM R foot, with R foot slap noted with gait, decreased timing and coordiantion of gait, with decreased RLE coordination with stair, obstacle, curb negotiation, decreased balance.  Pt has had at least 5 falls in the past 6 months (no falls since surgery).  He is at fall risk per DGI socre of  11/24, TUG score of 15.25 sec; he demonstrates decreased vestibular input for balance with increased sway on conditions of standing on foam and with eyes closed.  HE would benefit from skilled PT to address the above stated deficits to improve functional mobility and decrease fall risk.    Personal Factors and Comorbidities Comorbidity 3+    Comorbidities L4-5 microdisckectomy 01/05/2020; radiculopathy lumbar region, R foot drop, LBP with sciatica, lumbar herniated disk    Examination-Activity Limitations Locomotion Level;Transfers;Stairs;Stand    Examination-Participation Restrictions Community Activity;Yard Work    Merchant navy officer Evolving/Moderate complexity    Clinical Decision Making Moderate    Rehab Potential Good    PT Frequency 2x / week    PT Duration 8 weeks   including eval week   PT Treatment/Interventions ADLs/Self Care Home Management;Gait training;Functional mobility training;DME Instruction;Neuromuscular re-education;Balance training;Therapeutic exercise;Therapeutic activities;Patient/family education;Passive range of motion;Manual techniques;Stair training;Orthotic Fit/Training    PT Next Visit Plan Perform 6 MWT; Review sit<>stand, SLS HEP and curb negotiation from this visit.  Additional SLS balance, foot clearance, and R ankle strengthening; corner balance exerciese; gait, curb and stair training    PT Home Exercise Plan [place] and Agree with Plan of Care Patient           Patient will benefit from skilled therapeutic intervention in order to improve the following deficits and impairments:  Abnormal gait, Difficulty walking, Decreased range of motion, Decreased activity tolerance, Decreased balance, Impaired flexibility, Decreased mobility, Decreased strength, Decreased coordination  Visit Diagnosis: Other abnormalities of gait and mobility  Unsteadiness on feet  Muscle weakness (generalized)     Problem List Patient  Active Problem List   Diagnosis Date Noted  . S/P lumbar discectomy 01/26/2020  . Former smoker 03/23/2019  . Prostate cancer ([place]) 05/10/2015  . History of colonic polyps 05/10/2015  . Premature atrial contractions 05/10/2015  . Hyperlipidemia 01/25/2011  . CAD (coronary artery disease), native coronary artery 01/25/2011    Caresse Sedivy W. 02/16/2020, 5:29 AM  Frazier Butt., PT   [facility] [address] [place], [place], [address] Phone: [phone]   Fax:  [phone]  Name: Nahun Kronberg MRN: [id number] Date of Birth: 02/20/1946

## 2020-02-18 ENCOUNTER — Ambulatory Visit: Payer: PPO | Admitting: Physical Therapy

## 2020-02-18 ENCOUNTER — Encounter: Payer: Self-pay | Admitting: Physical Therapy

## 2020-02-18 ENCOUNTER — Other Ambulatory Visit: Payer: Self-pay

## 2020-02-18 DIAGNOSIS — R2689 Other abnormalities of gait and mobility: Secondary | ICD-10-CM | POA: Diagnosis not present

## 2020-02-18 DIAGNOSIS — M6281 Muscle weakness (generalized): Secondary | ICD-10-CM

## 2020-02-18 DIAGNOSIS — R2681 Unsteadiness on feet: Secondary | ICD-10-CM

## 2020-02-18 NOTE — Patient Instructions (Signed)
Access Code: [email or web address] URL: [email or web address] Date: 02/18/2020 Prepared by: Willow Ora  Exercises Sit to Stand with Armchair - 1 x daily - 7 x weekly - 3 sets - 10 reps Standing Single Leg Stance with Counter Support - 1 x daily - 5 x weekly - 1 sets - 3 reps - 30 hold Standing March - 1 x daily - 5 x weekly - 1 sets - 3 reps Tandem Walking with Counter Support - 1 x daily - 5 x weekly - 1 sets - 3 reps Toe Walking with Counter Support - 1 x daily - 5 x weekly - 1 sets - 3 reps

## 2020-02-18 NOTE — Therapy (Addendum)
[facility] [address] [place], [place], [address] Phone: [phone]   Fax:  [phone]  Physical Therapy Treatment  Patient Details  Name: Yichen Gilardi MRN: [id number] Date of Birth: 11-21-1945 Referring Provider (PT): Erline Levine, MD   Encounter Date: 02/18/2020   02/18/20 0934  PT Visits / Re-Eval  Visit Number 2  Number of Visits 16  Date for PT Re-Evaluation 16/07/37 (90 day cert for 8-wk POC (due to pt being out of town several times))     PT End of Session - 02/18/20 [place]    Number of Visits 16    Date for PT Re-Evaluation 10/62/69   90 day cert for 8-wk POC (due to pt being out of town several times)   Authorization Type [employer]    Progress Note Due on Visit 10    PT Start Time [phone]    PT Stop Time 1015    PT Time Calculation (min) 44 min    Equipment Utilized During Treatment Gait belt    Activity Tolerance Patient tolerated treatment well    Behavior During Therapy [facility] for tasks assessed/performed           Past Medical History:  Diagnosis Date  . Coronary artery disease    NONOBSTRUCTIVE  . Gallbladder disease   . GERD (gastroesophageal reflux disease)   . Hyperlipidemia   . Migraine aura without headache   . Prostate cancer ([place])   . Seasonal allergies     Past Surgical History:  Procedure Laterality Date  . CARDIAC CATHETERIZATION  07/16/2006   EF 50-55%  . CARDIOVASCULAR STRESS TEST  07/08/2006   EF 57%  . CATARACT EXTRACTION, BILATERAL    . COLONOSCOPY  09/14/2008  . CYSTOSCOPY N/A 03/25/2019   Procedure: CYSTOSCOPY;  Surgeon: Ardis Hughs, MD;  Location: [facility];  Service: Urology;  Laterality: N/A;  No seeds detected in bladder per Dr. Louis Meckel  . LAPAROSCOPIC CHOLECYSTECTOMY  2008  . PROSTATE BIOPSY  2009  . PROSTATE BIOPSY  06/27/2012  . PROSTATE BIOPSY  06/08/2013  . PROSTATE BIOPSY  03/19/2016  . PROSTATE BIOPSY  04/17/2018  .  RADIOACTIVE SEED IMPLANT N/A 03/25/2019   Procedure: RADIOACTIVE SEED IMPLANT/BRACHYTHERAPY IMPLANT;  Surgeon: Ardis Hughs, MD;  Location: [facility];  Service: Urology;  Laterality: N/A;  . SPACE OAR INSTILLATION N/A 03/25/2019   Procedure: SPACE OAR INSTILLATION;  Surgeon: Ardis Hughs, MD;  Location[facility];  Service: Urology;  Laterality: N/A;    There were no vitals filed for this visit.   Subjective Assessment - 02/18/20 0934    Subjective No new complatins. Reports the ex's at home are going well, no issues. No falls or pain to report.    Pertinent History L4-5 microdisckectomy 01/05/2020; radiculopathy lumbar region, R foot drop, LBP with sciatica, lumbar herniated disk    Patient Stated Goals To get my balance back is my primary goal.    Currently in Pain? No/denies              [facility] PT Assessment - 02/18/20 0936      6 Minute Walk- Baseline   6 Minute Walk- Baseline yes    BP (mmHg) 142/87    HR (bpm) 76    02 Sat (%RA) 96 %    Modified Borg Scale for Dyspnea 0- Nothing at all    Perceived Rate of Exertion (Borg) 6-      6  Minute walk- Post Test   6 Minute Walk Post Test yes    BP (mmHg) 150/85    HR (bpm) 86    02 Sat (%RA) 98 %    Modified Borg Scale for Dyspnea 0- Nothing at all    Perceived Rate of Exertion (Borg) 6-      6 minute walk test results    Aerobic Endurance Distance [facility] 1207    Endurance additional comments use of cane with no rest breaks. right foot slap noted with gait.                 [facility] Adult PT Treatment/Exercise - 02/18/20 0950      Transfers   Transfers Sit to Stand;Stand to Sit    Sit to Stand 5: Supervision;With upper extremity assist;From chair/3-in-1    Stand to Sit 5: Supervision;With upper extremity assist;To chair/3-in-1      Ambulation/Gait   Ambulation/Gait Yes    Ambulation/Gait Assistance 5: Supervision    Assistive device Straight cane    Gait Pattern  Step-through pattern;Decreased step length - left;Decreased stance time - right;Decreased step length - right;Decreased dorsiflexion - right;Decreased weight shift to right;Ataxic;Poor foot clearance - right;Wide base of support    Ambulation Surface Level;Indoor      Exercises   Exercises Other Exercises    Other Exercises  added new ex's for balance to HEP. Refer to [place] for full details. Cues needed on form and posutre.             Access Code: [email or web address] URL: [email or web address] Date: 02/18/2020 Prepared by: Willow Ora  Exercises Sit to Stand with Armchair - 1 x daily - 7 x weekly - 3 sets - 10 reps Standing Single Leg Stance with Counter Support - 1 x daily - 5 x weekly - 1 sets - 3 reps - 30 hold Standing March - 1 x daily - 5 x weekly - 1 sets - 3 reps Tandem Walking with Counter Support - 1 x daily - 5 x weekly - 1 sets - 3 reps Toe Walking with Counter Support - 1 x daily - 5 x weekly - 1 sets - 3 reps       PT Education - 02/18/20 0740    Education Details results of 6 minute walk test; additions to HEP    Person(s) Educated Patient    Methods Explanation;Demonstration;Verbal cues;Handout    Comprehension Verbalized understanding;Returned demonstration;Verbal cues required;Need further instruction            PT Short Term Goals - 02/15/20 2036      PT SHORT TERM GOAL #1   Title Pt will be independent with HEP for improved strength, balance, transfers, and gait.  TARGET 03/11/2020    Time 4    Period Weeks    Status New      PT SHORT TERM GOAL #2   Title Pt will improve TUG score to less than or equal to 13.5 seconds for decreased fall risk.    Time 4    Period Weeks    Status New      PT SHORT TERM GOAL #3   Title Pt will improve DGI score to at least 14/24 for decreased fall risk.    Time 4    Period Weeks    Status New      PT SHORT TERM GOAL #4   Title Pt will negotiate 4 steps, step through pattern, modified independently, for  improved stair negotiation in the home.  Time 4    Period Weeks    Status New      PT SHORT TERM GOAL #5   Title Pt will improve SLS to at least 2 seconds on RLE for improved stair, obstacle negotiation.    Time 4    Period Weeks    Status New             PT Long Term Goals - 02/16/20 9622      PT LONG TERM GOAL #1   Title Pt will be independent with progression of HEP for improved balance, strength, and gait.  TARGET 04/08/2020    Time 8    Period Weeks    Status New      PT LONG TERM GOAL #2   Title Pt will perform at least 5 reps of sit<>stand with minimal to no UE support, no posterior lean or loss of balance.    Time 8    Period Weeks    Status New      PT LONG TERM GOAL #3   Title Pt will improve DGI score to at least 19/24 for decreased fall risk.    Time 8    Period Weeks    Status New      PT LONG TERM GOAL #4   Title Pt will negotiate curb step, modified independently, for improved outdoor negotiation.    Time 8    Period Weeks    Status New      PT LONG TERM GOAL #5   Title Pt will ambulate at least 1000 ft, indoor and outdoor surfaces, modifiied independently, with cane (vs no device), for improved outdoor and community gait.    Time 8    Period Weeks    Status New                 Plan - 02/18/20 0935    Clinical Impression Statement Today's skilled session initially focused on establishing baseline distance for the 6 minute walk test with the primary PT to adjust goals as indicated. Remainder of session focused on review of ex's issued last time and addition of new ex's to pt's HEP. No issues reported or noted with performance in session today. The pt is progressing toward goals and should benefit from continued PT to progress toward unmet goals.    Personal Factors and Comorbidities Comorbidity 3+    Comorbidities L4-5 microdisckectomy 01/05/2020; radiculopathy lumbar region, R foot drop, LBP with sciatica, lumbar herniated disk     Examination-Activity Limitations Locomotion Level;Transfers;Stairs;Stand    Examination-Participation Restrictions Community Activity;Yard Work    Merchant navy officer Evolving/Moderate complexity    Rehab Potential Good    PT Frequency 2x / week    PT Duration 8 weeks   including eval week   PT Treatment/Interventions ADLs/Self Care Home Management;Gait training;Functional mobility training;DME Instruction;Neuromuscular re-education;Balance training;Therapeutic exercise;Therapeutic activities;Patient/family education;Passive range of motion;Manual techniques;Stair training;Orthotic Fit/Training    PT Next Visit Plan continue to work on SLS balance, foot clearance, and R ankle strengthening; corner balance exerciese; gait, curb and stair training    PT Home Exercise Plan [place] and Agree with Plan of Care Patient           Patient will benefit from skilled therapeutic intervention in order to improve the following deficits and impairments:  Abnormal gait, Difficulty walking, Decreased range of motion, Decreased activity tolerance, Decreased balance, Impaired flexibility, Decreased mobility, Decreased strength, Decreased coordination  Visit Diagnosis: Other abnormalities of  gait and mobility  Unsteadiness on feet  Muscle weakness (generalized)     Problem List Patient Active Problem List   Diagnosis Date Noted  . S/P lumbar discectomy 01/26/2020  . Former smoker 03/23/2019  . Prostate cancer ([place]) 05/10/2015  . History of colonic polyps 05/10/2015  . Premature atrial contractions 05/10/2015  . Hyperlipidemia 01/25/2011  . CAD (coronary artery disease), native coronary artery 01/25/2011    Willow Ora, PTA, [facility] [address], [place], [place] [address] [phone] 02/19/20, 7:48 AM   Name: Jarrius [place]aracha MRN: [id number] Date of Birth: 18-Jul-1945

## 2020-02-23 ENCOUNTER — Ambulatory Visit: Payer: PPO | Admitting: Physical Therapy

## 2020-02-23 ENCOUNTER — Other Ambulatory Visit: Payer: Self-pay

## 2020-02-23 DIAGNOSIS — R2689 Other abnormalities of gait and mobility: Secondary | ICD-10-CM

## 2020-02-23 NOTE — Therapy (Signed)
[facility] [address] [place], [place], [address] Phone: [phone]   Fax:  [phone]  Physical Therapy Treatment  Patient Details  Name: Bernard Reed MRN: [id number] Date of Birth: 02/01/1946 Referring Provider (PT): Erline Levine, MD   Encounter Date: 02/23/2020   PT End of Session - 02/23/20 0925    Visit Number 3    Number of Visits 16    Date for PT Re-Evaluation 13/24/40   90 day cert for 8-wk POC (due to pt being out of town several times)   Authorization Type [employer]    Progress Note Due on Visit 10    PT Start Time [phone]    PT Stop Time [phone]   Education only this session, due to pt's recent falls; need to f/u with Dr. Vertell Limber before further activity in therapy   PT Time Calculation (min) 30 min    Activity Tolerance Patient tolerated treatment well    Behavior During Therapy [facility] for tasks assessed/performed           Past Medical History:  Diagnosis Date  . Coronary artery disease    NONOBSTRUCTIVE  . Gallbladder disease   . GERD (gastroesophageal reflux disease)   . Hyperlipidemia   . Migraine aura without headache   . Prostate cancer ([place])   . Seasonal allergies     Past Surgical History:  Procedure Laterality Date  . CARDIAC CATHETERIZATION  07/16/2006   EF 50-55%  . CARDIOVASCULAR STRESS TEST  07/08/2006   EF 57%  . CATARACT EXTRACTION, BILATERAL    . COLONOSCOPY  09/14/2008  . CYSTOSCOPY N/A 03/25/2019   Procedure: CYSTOSCOPY;  Surgeon: Ardis Hughs, MD;  Location: [facility];  Service: Urology;  Laterality: N/A;  No seeds detected in bladder per Dr. Louis Meckel  . LAPAROSCOPIC CHOLECYSTECTOMY  2008  . PROSTATE BIOPSY  2009  . PROSTATE BIOPSY  06/27/2012  . PROSTATE BIOPSY  06/08/2013  . PROSTATE BIOPSY  03/19/2016  . PROSTATE BIOPSY  04/17/2018  . RADIOACTIVE SEED IMPLANT N/A 03/25/2019   Procedure: RADIOACTIVE SEED IMPLANT/BRACHYTHERAPY IMPLANT;   Surgeon: Ardis Hughs, MD;  Location: [facility];  Service: Urology;  Laterality: N/A;  . SPACE OAR INSTILLATION N/A 03/25/2019   Procedure: SPACE OAR INSTILLATION;  Surgeon: Ardis Hughs, MD;  Location: [facility];  Service: Urology;  Laterality: N/A;    There were no vitals filed for this visit.   Subjective Assessment - 02/23/20 0850    Subjective Tried the exercises, and the walking on toes is almost impossible.  Fell twice yesterday.  One was cleaning the birdbath, trying to pivot turn and the right leg just doesn't move to pivot.  The other was missing the step to get into the house.    Pertinent History L4-5 microdisckectomy 01/05/2020; radiculopathy lumbar region, R foot drop, LBP with sciatica, lumbar herniated disk    Patient Stated Goals To get my balance back is my primary goal.    Currently in Pain? Yes    Pain Score --   discomfort   Pain Location Leg    Pain Orientation Right    Pain Descriptors / Indicators Tingling    Pain Type Acute pain   since fall   Pain Onset In the past 7 days    Pain Frequency Intermittent    Aggravating Factors  recent fall    Pain Relieving Factors unsure; okay while sitting  Discussed pt's recent falls in past 2 days, with pt reporting some tingling pain sensations in RLE hip/thigh area (intermittent since falls).  He reports this was the pain he had prior to surgery (that was resolved with surgery).  Given pt's surgery 01/05/2020, PT place call to Dr. Melven Sartorius office and left message; requested pt call and follow up to ask what he should do regarding therapy given new symptoms since falls.  Discussed fall prevention education (not written info provided, as fall prevention education instructions not pulling up to print), including use of cane at all times outdoors, avoiding over-fatigue and over-exertion with activities, slowed/pace/caution with stairs and  unlevel ground.  Educated in adequate lighting, clear and free paths for gait indoors.  Pt negotiates 4 steps, alternating pattern with cane and 1 rail, cues for adequate foot clearance and technique for slowed, safe pace.     PT Education - 02/23/20 0924    Education Details Fall prevention education; requested pt contact Dr. Melven Sartorius office for follow-up from 2 recent falls, prior to return to therapy    Person(s) Educated Patient    Methods Explanation    Comprehension Verbalized understanding            PT Short Term Goals - 02/15/20 2036      PT SHORT TERM GOAL #1   Title Pt will be independent with HEP for improved strength, balance, transfers, and gait.  TARGET 03/11/2020    Time 4    Period Weeks    Status New      PT SHORT TERM GOAL #2   Title Pt will improve TUG score to less than or equal to 13.5 seconds for decreased fall risk.    Time 4    Period Weeks    Status New      PT SHORT TERM GOAL #3   Title Pt will improve DGI score to at least 14/24 for decreased fall risk.    Time 4    Period Weeks    Status New      PT SHORT TERM GOAL #4   Title Pt will negotiate 4 steps, step through pattern, modified independently, for improved stair negotiation in the home.    Time 4    Period Weeks    Status New      PT SHORT TERM GOAL #5   Title Pt will improve SLS to at least 2 seconds on RLE for improved stair, obstacle negotiation.    Time 4    Period Weeks    Status New             PT Long Term Goals - 02/16/20 1443      PT LONG TERM GOAL #1   Title Pt will be independent with progression of HEP for improved balance, strength, and gait.  TARGET 04/08/2020    Time 8    Period Weeks    Status New      PT LONG TERM GOAL #2   Title Pt will perform at least 5 reps of sit<>stand with minimal to no UE support, no posterior lean or loss of balance.    Time 8    Period Weeks    Status New      PT LONG TERM GOAL #3   Title Pt will improve DGI score to at least  19/24 for decreased fall risk.    Time 8    Period Weeks    Status New      PT LONG TERM  GOAL #4   Title Pt will negotiate curb step, modified independently, for improved outdoor negotiation.    Time 8    Period Weeks    Status New      PT LONG TERM GOAL #5   Title Pt will ambulate at least 1000 ft, indoor and outdoor surfaces, modifiied independently, with cane (vs no device), for improved outdoor and community gait.    Time 8    Period Weeks    Status New                 Plan - 02/23/20 0925    Clinical Impression Statement Pt presents to OPPT today, reporting 2 falls in the past several days, both of which occurred outside without use of cane.  Pt reports some intermittent symptoms of tingling pain in RLE into hip/thigh area since the 2 falls (pt reports this is the pain he had prior to the surgery and surgery had resolved the pain).  Given pt's history of back surgery 01/05/2020, PT placed call to Dr. Melven Sartorius office and left message, requested pt f/u with their office for recommendations prior to return to PT.  Provided fall prevention education during session today.    Personal Factors and Comorbidities Comorbidity 3+    Comorbidities L4-5 microdisckectomy 01/05/2020; radiculopathy lumbar region, R foot drop, LBP with sciatica, lumbar herniated disk    Examination-Activity Limitations Locomotion Level;Transfers;Stairs;Stand    Examination-Participation Restrictions Community Activity;Yard Work    [employer] officer Evolving/Moderate complexity    Rehab Potential Good    PT Frequency 2x / week    PT Duration 8 weeks   including eval week   PT Treatment/Interventions ADLs/Self Care Home Management;Gait training;Functional mobility training;DME Instruction;Neuromuscular re-education;Balance training;Therapeutic exercise;Therapeutic activities;Patient/family education;Passive range of motion;Manual techniques;Stair training;Orthotic Fit/Training    PT Next Visit Plan  Pt to f/u with Dr. Melven Sartorius office (from 2 recent falls and return of R tingling sensation in RLE since falls); PT recommends pt have clearance/recommendations from Dr. Melven Sartorius office prior to coming back to therapy.  In next therapy session:  review HEP and continue to work balance activities; compliant surfaces;  trial foot-up brace with gait    PT [place] and Agree with Plan of Care Patient           Patient will benefit from skilled therapeutic intervention in order to improve the following deficits and impairments:  Abnormal gait, Difficulty walking, Decreased range of motion, Decreased activity tolerance, Decreased balance, Impaired flexibility, Decreased mobility, Decreased strength, Decreased coordination  Visit Diagnosis: Other abnormalities of gait and mobility     Problem List Patient Active Problem List   Diagnosis Date Noted  . S/P lumbar discectomy 01/26/2020  . Former smoker 03/23/2019  . Prostate cancer ([place]) 05/10/2015  . History of colonic polyps 05/10/2015  . Premature atrial contractions 05/10/2015  . Hyperlipidemia 01/25/2011  . CAD (coronary artery disease), native coronary artery 01/25/2011    Ayomikun Starling W. 02/23/2020, 9:30 AM  Frazier Butt., PT   [facility] [address] [place], [place], [address] Phone: [phone]   Fax:  [phone]  Name: Bernard Reed MRN: [id number] Date of Birth: 08-Feb-1946

## 2020-02-24 ENCOUNTER — Ambulatory Visit: Payer: PPO | Admitting: Physical Therapy

## 2020-03-02 ENCOUNTER — Other Ambulatory Visit: Payer: Self-pay

## 2020-03-02 ENCOUNTER — Ambulatory Visit: Payer: PPO | Attending: Neurosurgery | Admitting: Physical Therapy

## 2020-03-02 DIAGNOSIS — R2681 Unsteadiness on feet: Secondary | ICD-10-CM | POA: Insufficient documentation

## 2020-03-02 DIAGNOSIS — R2689 Other abnormalities of gait and mobility: Secondary | ICD-10-CM | POA: Diagnosis not present

## 2020-03-02 DIAGNOSIS — M6281 Muscle weakness (generalized): Secondary | ICD-10-CM | POA: Insufficient documentation

## 2020-03-02 NOTE — Therapy (Signed)
[facility] [address] [place], [place], [address] Phone: [phone]   Fax:  [phone]  Physical Therapy Treatment  Patient Details  Name: Bernard Reed MRN: [id number] Date of Birth: April 26, 1946 Referring Provider (PT): Erline Levine, MD   Encounter Date: 03/02/2020   PT End of Session - 03/02/20 1414    Visit Number 4    Number of Visits 16    Date for PT Re-Evaluation 83/38/25   90 day cert for 8-wk POC (due to pt being out of town several times)   Authorization Type [employer]    Progress Note Due on Visit 10    PT Start Time 1233    PT Stop Time 1315    PT Time Calculation (min) 42 min    Activity Tolerance Patient tolerated treatment well    Behavior During Therapy [facility] for tasks assessed/performed           Past Medical History:  Diagnosis Date  . Coronary artery disease    NONOBSTRUCTIVE  . Gallbladder disease   . GERD (gastroesophageal reflux disease)   . Hyperlipidemia   . Migraine aura without headache   . Prostate cancer ([place])   . Seasonal allergies     Past Surgical History:  Procedure Laterality Date  . CARDIAC CATHETERIZATION  07/16/2006   EF 50-55%  . CARDIOVASCULAR STRESS TEST  07/08/2006   EF 57%  . CATARACT EXTRACTION, BILATERAL    . COLONOSCOPY  09/14/2008  . CYSTOSCOPY N/A 03/25/2019   Procedure: CYSTOSCOPY;  Surgeon: Ardis Hughs, MD;  Location: [facility];  Service: Urology;  Laterality: N/A;  No seeds detected in bladder per Dr. Louis Meckel  . LAPAROSCOPIC CHOLECYSTECTOMY  2008  . PROSTATE BIOPSY  2009  . PROSTATE BIOPSY  06/27/2012  . PROSTATE BIOPSY  06/08/2013  . PROSTATE BIOPSY  03/19/2016  . PROSTATE BIOPSY  04/17/2018  . RADIOACTIVE SEED IMPLANT N/A 03/25/2019   Procedure: RADIOACTIVE SEED IMPLANT/BRACHYTHERAPY IMPLANT;  Surgeon: Ardis Hughs, MD;  Location: [facility];  Service: Urology;  Laterality: N/A;  . SPACE OAR  INSTILLATION N/A 03/25/2019   Procedure: SPACE OAR INSTILLATION;  Surgeon: Ardis Hughs, MD;  Location: [facility];  Service: Urology;  Laterality: N/A;    There were no vitals filed for this visit.   Access Code: [email or web address] URL: [email or web address] Date: 03/02/2020 Prepared by: Nita Sells  Exercises Sit to Stand with Armchair - 1 x daily - 7 x weekly - 3 sets - 10 reps Standing Single Leg Stance with Counter Support - 1 x daily - 5 x weekly - 1 sets - 3 reps - 10 second hold Standing March - 1 x daily - 5 x weekly - 1 sets - 3 reps Heel rises with counter support - 1 x daily - 5 x weekly - 3 sets - 10 reps Toe Walking with Counter Support - 1 x daily - 5 x weekly - 1 sets - 3 reps Standing Tandem Balance with Counter Support - 1 x daily - 5 x weekly - 3 sets - 1 reps - 10 second hold Tandem Walking with Counter Support - 1 x daily - 5 x weekly - 1 sets - 3 reps    [facility] Adult PT Treatment/Exercise - 03/02/20 0001      Transfers   Transfers Sit to Stand;Stand to Sit    Sit to Stand 5: Supervision;With upper extremity assist;From chair/3-in-1    Stand to Sit  5: Supervision;With upper extremity assist;To chair/3-in-1      Ambulation/Gait   Ambulation/Gait Yes    Ambulation/Gait Assistance 5: Supervision    Ambulation/Gait Assistance Details in/out of clinic and between activities    Assistive device Straight cane    Gait Pattern Step-through pattern;Decreased step length - left;Decreased stance time - right;Decreased step length - right;Decreased dorsiflexion - right;Decreased weight shift to right;Ataxic;Poor foot clearance - right;Wide base of support    Ambulation Surface Level;Indoor      Exercises   Exercises Other Exercises    Other Exercises  See HEP for details of exercises performed during session today.  Also performed tandem stance x 10 sec x 3 sets each side, tandem gait in // bars forward and backward x 8 reps, SLS x 10 sec x 3  sets, Heel raises bil and single leg x 10 reps                  PT Education - 03/02/20 1414    Education Details HEP additions and revisions    Person(s) Educated Patient    Methods Explanation;Demonstration;Verbal cues;Handout    Comprehension Verbalized understanding            PT Short Term Goals - 02/15/20 2036      PT SHORT TERM GOAL #1   Title Pt will be independent with HEP for improved strength, balance, transfers, and gait.  TARGET 03/11/2020    Time 4    Period Weeks    Status New      PT SHORT TERM GOAL #2   Title Pt will improve TUG score to less than or equal to 13.5 seconds for decreased fall risk.    Time 4    Period Weeks    Status New      PT SHORT TERM GOAL #3   Title Pt will improve DGI score to at least 14/24 for decreased fall risk.    Time 4    Period Weeks    Status New      PT SHORT TERM GOAL #4   Title Pt will negotiate 4 steps, step through pattern, modified independently, for improved stair negotiation in the home.    Time 4    Period Weeks    Status New      PT SHORT TERM GOAL #5   Title Pt will improve SLS to at least 2 seconds on RLE for improved stair, obstacle negotiation.    Time 4    Period Weeks    Status New             PT Long Term Goals - 02/16/20 6644      PT LONG TERM GOAL #1   Title Pt will be independent with progression of HEP for improved balance, strength, and gait.  TARGET 04/08/2020    Time 8    Period Weeks    Status New      PT LONG TERM GOAL #2   Title Pt will perform at least 5 reps of sit<>stand with minimal to no UE support, no posterior lean or loss of balance.    Time 8    Period Weeks    Status New      PT LONG TERM GOAL #3   Title Pt will improve DGI score to at least 19/24 for decreased fall risk.    Time 8    Period Weeks    Status New      PT LONG TERM GOAL #4  Title Pt will negotiate curb step, modified independently, for improved outdoor negotiation.    Time 8    Period  Weeks    Status New      PT LONG TERM GOAL #5   Title Pt will ambulate at least 1000 ft, indoor and outdoor surfaces, modifiied independently, with cane (vs no device), for improved outdoor and community gait.    Time 8    Period Weeks    Status New                 Plan - 03/02/20 1415    Clinical Impression Statement Pt denies any changes since last session and Dr Vertell Limber cleared pt to continue with PT.  Pt with significant difficulty with balance especially with tandem gait and walking on toes.  Added to previous HEP.  Cont PT per poc.    Personal Factors and Comorbidities Comorbidity 3+    Comorbidities L4-5 microdisckectomy 01/05/2020; radiculopathy lumbar region, R foot drop, LBP with sciatica, lumbar herniated disk    Examination-Activity Limitations Locomotion Level;Transfers;Stairs;Stand    Examination-Participation Restrictions Community Activity;Yard Work    [employer] officer Evolving/Moderate complexity    Rehab Potential Good    PT Frequency 2x / week    PT Duration 8 weeks   including eval week   PT Treatment/Interventions ADLs/Self Care Home Management;Gait training;Functional mobility training;DME Instruction;Neuromuscular re-education;Balance training;Therapeutic exercise;Therapeutic activities;Patient/family education;Passive range of motion;Manual techniques;Stair training;Orthotic Fit/Training    PT Next Visit Plan In next therapy session:  review HEP and continue to work balance activities; compliant surfaces;  trial foot-up brace with gait and assess for seated LE/ankle exercises.    PT Home Exercise Plan [place]    Consulted and Agree with Plan of Care Patient           Patient will benefit from skilled therapeutic intervention in order to improve the following deficits and impairments:  Abnormal gait, Difficulty walking, Decreased range of motion, Decreased activity tolerance, Decreased balance, Impaired flexibility, Decreased  mobility, Decreased strength, Decreased coordination  Visit Diagnosis: Other abnormalities of gait and mobility  Unsteadiness on feet  Muscle weakness (generalized)     Problem List Patient Active Problem List   Diagnosis Date Noted  . S/P lumbar discectomy 01/26/2020  . Former smoker 03/23/2019  . Prostate cancer ([place]) 05/10/2015  . History of colonic polyps 05/10/2015  . Premature atrial contractions 05/10/2015  . Hyperlipidemia 01/25/2011  . CAD (coronary artery disease), native coronary artery 01/25/2011    Narda Bonds, PTA [place] 03/02/20 2:26 PM Phone: [phone] Fax: [facility] [address] [place], [place], [address] Phone: [phone]   Fax:  [phone]  Name: Bernard Reed MRN: [id number] Date of Birth: [birth year]

## 2020-03-02 NOTE — Patient Instructions (Signed)
Access Code: [email or web address] URL: [email or web address] Date: 03/02/2020 Prepared by: Nita Sells  Exercises Sit to Stand with Armchair - 1 x daily - 7 x weekly - 3 sets - 10 reps Standing Single Leg Stance with Counter Support - 1 x daily - 5 x weekly - 1 sets - 3 reps - 10 second hold Standing March - 1 x daily - 5 x weekly - 1 sets - 3 reps Heel rises with counter support - 1 x daily - 5 x weekly - 3 sets - 10 reps Toe Walking with Counter Support - 1 x daily - 5 x weekly - 1 sets - 3 reps Standing Tandem Balance with Counter Support - 1 x daily - 5 x weekly - 3 sets - 1 reps - 10 second hold Tandem Walking with Counter Support - 1 x daily - 5 x weekly - 1 sets - 3 reps

## 2020-03-04 ENCOUNTER — Other Ambulatory Visit: Payer: Self-pay

## 2020-03-04 ENCOUNTER — Ambulatory Visit: Payer: PPO | Admitting: Physical Therapy

## 2020-03-04 ENCOUNTER — Encounter: Payer: Self-pay | Admitting: Physical Therapy

## 2020-03-04 DIAGNOSIS — R2689 Other abnormalities of gait and mobility: Secondary | ICD-10-CM

## 2020-03-04 DIAGNOSIS — M6281 Muscle weakness (generalized): Secondary | ICD-10-CM

## 2020-03-04 DIAGNOSIS — R2681 Unsteadiness on feet: Secondary | ICD-10-CM

## 2020-03-04 NOTE — Therapy (Signed)
[place] [address] [place], [place], [address] Phone: [phone]   Fax:  [phone]  Physical Therapy Treatment  Patient Details  Name: Bernard Reed MRN: [id number] Date of Birth: 1946/06/18 Referring Provider (PT): Erline Levine, MD   Encounter Date: 03/04/2020   PT End of Session - 03/04/20 1435    Visit Number 5    Number of Visits 16    Date for PT Re-Evaluation 88/41/66   90 day cert for 8-wk POC (due to pt being out of town several times)   Authorization Type [employer]    Progress Note Due on Visit 10    PT Start Time 1020    PT Stop Time 1105    PT Time Calculation (min) 45 min    Equipment Utilized During Treatment Gait belt    Activity Tolerance Patient tolerated treatment well    Behavior During Therapy [facility] for tasks assessed/performed           Past Medical History:  Diagnosis Date  . Coronary artery disease    NONOBSTRUCTIVE  . Gallbladder disease   . GERD (gastroesophageal reflux disease)   . Hyperlipidemia   . Migraine aura without headache   . Prostate cancer ([place])   . Seasonal allergies     Past Surgical History:  Procedure Laterality Date  . CARDIAC CATHETERIZATION  07/16/2006   EF 50-55%  . CARDIOVASCULAR STRESS TEST  07/08/2006   EF 57%  . CATARACT EXTRACTION, BILATERAL    . COLONOSCOPY  09/14/2008  . CYSTOSCOPY N/A 03/25/2019   Procedure: CYSTOSCOPY;  Surgeon: Ardis Hughs, MD;  Location: [facility];  Service: Urology;  Laterality: N/A;  No seeds detected in bladder per Dr. Louis Meckel  . LAPAROSCOPIC CHOLECYSTECTOMY  2008  . PROSTATE BIOPSY  2009  . PROSTATE BIOPSY  06/27/2012  . PROSTATE BIOPSY  06/08/2013  . PROSTATE BIOPSY  03/19/2016  . PROSTATE BIOPSY  04/17/2018  . RADIOACTIVE SEED IMPLANT N/A 03/25/2019   Procedure: RADIOACTIVE SEED IMPLANT/BRACHYTHERAPY IMPLANT;  Surgeon: Ardis Hughs, MD;  Location: [facility];   Service: Urology;  Laterality: N/A;  . SPACE OAR INSTILLATION N/A 03/25/2019   Procedure: SPACE OAR INSTILLATION;  Surgeon: Ardis Hughs, MD;  Location: [facility];  Service: Urology;  Laterality: N/A;    There were no vitals filed for this visit.   Subjective Assessment - 03/04/20 1427    Subjective Denies any falls or changes since last visit.  Reports having difficulty with up/down curbs and wonders if its "in my head".    Pertinent History L4-5 microdisckectomy 01/05/2020; radiculopathy lumbar region, R foot drop, LBP with sciatica, lumbar herniated disk    Patient Stated Goals To get my balance back is my primary goal.    Currently in Pain? No/denies                        [facility] Adult PT Treatment/Exercise - 03/04/20 0001      Transfers   Transfers Sit to Stand;Stand to Sit    Sit to Stand 5: Supervision;With upper extremity assist;From chair/3-in-1    Stand to Sit 5: Supervision;With upper extremity assist;To chair/3-in-1      Ambulation/Gait   Ambulation/Gait Yes    Ambulation/Gait Assistance 5: Supervision    Ambulation/Gait Assistance Details Trial of foot up brace on R.  Pt with improved clearance on R and decrease slap of R foot.  Pt states  he feels like it helps significantly.  No lob with ambulation.    Ambulation Distance (Feet) 250 Feet   x 1 and 700' x 1 and around gym for activities   Assistive device Straight cane;Other (Comment)   foot up brace   Gait Pattern Step-through pattern;Decreased step length - left;Decreased stance time - right;Decreased step length - right;Decreased dorsiflexion - right;Decreased weight shift to right;Ataxic;Poor foot clearance - right;Wide base of support    Ambulation Surface Level;Unlevel;Indoor;Outdoor;Paved;Gravel    Curb 4: Min assist    Curb Details (indicate cue type and reason) Pt instructed in proper technique for curb regarding going down with R first and up with L first.  Pt tends to have  significant clonic/tremor/ataxia on R especially when he become fearful of fall.  This occurs both when going up and down ramp.  Did improve with practice but anytime his R foot would slightly catch the tremors increased and pt needed min assist for balance.      Gait Comments Still tends to carry cane at times and reports he does not always use it, especially at home or walking to his workshop outside home.      Self-Care   Self-Care --                  PT Education - 03/04/20 1435    Education Details Curb training, where to obtain foot up brace    Person(s) Educated Patient    Methods Explanation;Demonstration;Verbal cues;Tactile cues    Comprehension Need further instruction            PT Short Term Goals - 02/15/20 2036      PT SHORT TERM GOAL #1   Title Pt will be independent with HEP for improved strength, balance, transfers, and gait.  TARGET 03/11/2020    Time 4    Period Weeks    Status New      PT SHORT TERM GOAL #2   Title Pt will improve TUG score to less than or equal to 13.5 seconds for decreased fall risk.    Time 4    Period Weeks    Status New      PT SHORT TERM GOAL #3   Title Pt will improve DGI score to at least 14/24 for decreased fall risk.    Time 4    Period Weeks    Status New      PT SHORT TERM GOAL #4   Title Pt will negotiate 4 steps, step through pattern, modified independently, for improved stair negotiation in the home.    Time 4    Period Weeks    Status New      PT SHORT TERM GOAL #5   Title Pt will improve SLS to at least 2 seconds on RLE for improved stair, obstacle negotiation.    Time 4    Period Weeks    Status New             PT Long Term Goals - 02/16/20 6295      PT LONG TERM GOAL #1   Title Pt will be independent with progression of HEP for improved balance, strength, and gait.  TARGET 04/08/2020    Time 8    Period Weeks    Status New      PT LONG TERM GOAL #2   Title Pt will perform at least 5 reps of  sit<>stand with minimal to no UE support, no posterior lean or loss of  balance.    Time 8    Period Weeks    Status New      PT LONG TERM GOAL #3   Title Pt will improve DGI score to at least 19/24 for decreased fall risk.    Time 8    Period Weeks    Status New      PT LONG TERM GOAL #4   Title Pt will negotiate curb step, modified independently, for improved outdoor negotiation.    Time 8    Period Weeks    Status New      PT LONG TERM GOAL #5   Title Pt will ambulate at least 1000 ft, indoor and outdoor surfaces, modifiied independently, with cane (vs no device), for improved outdoor and community gait.    Time 8    Period Weeks    Status New                 Plan - 03/04/20 1436    Clinical Impression Statement Skilled session focused on gait and curb with Foot up brace.  Pt does have difficulty with curb and tremor/clonic present in RLE when performing curb along with lob.  Pt did have improvement in gait and decreased foot slap on R with foot up brace.  Cont per poc.    Personal Factors and Comorbidities Comorbidity 3+    Comorbidities L4-5 microdisckectomy 01/05/2020; radiculopathy lumbar region, R foot drop, LBP with sciatica, lumbar herniated disk    Examination-Activity Limitations Locomotion Level;Transfers;Stairs;Stand    Examination-Participation Restrictions Community Activity;Yard Work    [employer] officer Evolving/Moderate complexity    Rehab Potential Good    PT Frequency 2x / week    PT Duration 8 weeks   including eval week   PT Treatment/Interventions ADLs/Self Care Home Management;Gait training;Functional mobility training;DME Instruction;Neuromuscular re-education;Balance training;Therapeutic exercise;Therapeutic activities;Patient/family education;Passive range of motion;Manual techniques;Stair training;Orthotic Fit/Training    PT Next Visit Plan Ask if he purchased foot up brace.  Has he tried a curb anywhere?  Add taps to step to his  HEP to encourage SLS.  In next therapy session:  review HEP and continue to work balance activities; compliant surfaces;  assess for seated LE/ankle exercises.    PT Home Exercise Plan [place]    Consulted and Agree with Plan of Care Patient           Patient will benefit from skilled therapeutic intervention in order to improve the following deficits and impairments:  Abnormal gait, Difficulty walking, Decreased range of motion, Decreased activity tolerance, Decreased balance, Impaired flexibility, Decreased mobility, Decreased strength, Decreased coordination  Visit Diagnosis: Other abnormalities of gait and mobility  Unsteadiness on feet  Muscle weakness (generalized)     Problem List Patient Active Problem List   Diagnosis Date Noted  . S/P lumbar discectomy 01/26/2020  . Former smoker 03/23/2019  . Prostate cancer ([place]) 05/10/2015  . History of colonic polyps 05/10/2015  . Premature atrial contractions 05/10/2015  . Hyperlipidemia 01/25/2011  . CAD (coronary artery disease), native coronary artery 01/25/2011    Narda Bonds, PTA [place] 03/04/20 2:40 PM Phone: [phone] Fax: [place] [address] [place], [place], [address] Phone: [phone]   Fax:  [phone]  Name: Bernard Reed MRN: [id number] Date of Birth: 1946/01/17

## 2020-03-09 ENCOUNTER — Ambulatory Visit: Payer: PPO | Admitting: Physical Therapy

## 2020-03-10 ENCOUNTER — Ambulatory Visit: Payer: PPO | Admitting: Physical Therapy

## 2020-03-11 ENCOUNTER — Ambulatory Visit (INDEPENDENT_AMBULATORY_CARE_PROVIDER_SITE_OTHER): Payer: PPO | Admitting: Family Medicine

## 2020-03-11 ENCOUNTER — Encounter: Payer: Self-pay | Admitting: Family Medicine

## 2020-03-11 VITALS — BP 124/70 | HR 105 | Temp 98.0°F | Wt 196.0 lb

## 2020-03-11 DIAGNOSIS — W07XXXA Fall from chair, initial encounter: Secondary | ICD-10-CM

## 2020-03-11 DIAGNOSIS — H938X1 Other specified disorders of right ear: Secondary | ICD-10-CM

## 2020-03-11 DIAGNOSIS — S300XXA Contusion of lower back and pelvis, initial encounter: Secondary | ICD-10-CM

## 2020-03-11 NOTE — Progress Notes (Signed)
   Subjective:    Patient ID: Bernard Reed, male    DOB: [birth year], 74 y.o.   MRN: [id number]  HPI Chief Complaint  Patient presents with  . fell    fell on saturday at [employer] and fell off stool, bruised right buttock- painful to sit down   Here with complaints of right buttock pain and bruising since falling off of a stool and "slipping down to the floor" at a restaurant approximately one week ago. . States he only hit his buttocks and did not hit his head and no other injuries. States he did not have much pain after the fall and was able to sit and eat his meal. States he did not have much bruising until last night.   States he had back surgery last month on his L4-L5 by Dr. Vertell Limber. Doing PT States he is doing well with this.   Denies fever, chills, dizziness, headache, chest pain, palpitations, shortness of breath, abdominal pain, N/V/D.  No weakness of his extremities.   Reviewed allergies, medications, past medical, surgical, family, and social history.  States his right ear feels clogged. He would like to have his ears flushed.  No pain.      Review of Systems Pertinent positives and negatives in the history of present illness.     Objective:   Physical Exam HENT:     Ears:     Comments: Bilateral ears with cerumen impaction prior to ear lavage.  Musculoskeletal:     Lumbar back: No tenderness.       Back:       Legs:     Comments: Well healing incision to lumbar spine. Purplish bruise with surrounding yellowish consistent with healing. Non tender. Right buttock is soft to palpation.  He has normal ROM of his right hip and lower extremity.  Good cap refill and pulses of bilateral feet   Skin:    General: Skin is warm and dry.     Capillary Refill: Capillary refill takes less than 2 seconds.     Findings: Bruising present.  Neurological:     General: No focal deficit present.     Mental Status: He is oriented to person, place, and time.     Motor: No  weakness.     Coordination: Coordination normal.    BP 124/70   Pulse (!) 105   Temp 98 F (36.7 C)   Wt 196 lb (88.9 kg)   BMI 28.94 kg/m         Assessment & Plan:  Fall from high chair, initial encounter  Traumatic hematoma of buttock, initial encounter  Clogged ear, right  Discussed that he has good range of motion of his right hip and low back and there is no indication that he did any serious damage to himself with the fall. Advised the hematoma will continue to heal.  Bilateral ear lavage done by Sabrina CMA due to cerumen impaction. He tolerated this well. Improvement reported post lavage.

## 2020-03-14 ENCOUNTER — Ambulatory Visit: Payer: PPO | Admitting: Physical Therapy

## 2020-03-16 ENCOUNTER — Ambulatory Visit: Payer: PPO | Admitting: Physical Therapy

## 2020-03-22 ENCOUNTER — Ambulatory Visit: Payer: PPO | Admitting: Physical Therapy

## 2020-03-24 ENCOUNTER — Ambulatory Visit: Payer: PPO | Admitting: Physical Therapy

## 2020-03-28 ENCOUNTER — Ambulatory Visit: Payer: PPO | Admitting: Physical Therapy

## 2020-04-01 ENCOUNTER — Ambulatory Visit: Payer: PPO | Attending: Neurosurgery | Admitting: Physical Therapy

## 2020-04-01 ENCOUNTER — Encounter: Payer: Self-pay | Admitting: Physical Therapy

## 2020-04-01 ENCOUNTER — Other Ambulatory Visit: Payer: Self-pay

## 2020-04-01 DIAGNOSIS — R2689 Other abnormalities of gait and mobility: Secondary | ICD-10-CM | POA: Insufficient documentation

## 2020-04-01 DIAGNOSIS — M6281 Muscle weakness (generalized): Secondary | ICD-10-CM | POA: Insufficient documentation

## 2020-04-01 DIAGNOSIS — R2681 Unsteadiness on feet: Secondary | ICD-10-CM | POA: Diagnosis not present

## 2020-04-01 NOTE — Therapy (Signed)
[place] [address] [place], [place], [address] Phone: [phone]   Fax:  [phone]  Physical Therapy Treatment  Patient Details  Name: Bernard Reed MRN: [id number] Date of Birth: Jul 25, 1945 Referring Provider (PT): Erline Levine, MD   Encounter Date: 04/01/2020   PT End of Session - 04/01/20 0939    Visit Number 6    Number of Visits 16    Date for PT Re-Evaluation 93/23/55   90 day cert for 8-wk POC (due to pt being out of town several times)   Authorization Type [employer]    Progress Note Due on Visit 10    PT Start Time [phone]    PT Stop Time 1015    PT Time Calculation (min) 41 min    Equipment Utilized During Treatment Gait belt    Activity Tolerance Patient tolerated treatment well    Behavior During Therapy [facility] for tasks assessed/performed           Past Medical History:  Diagnosis Date  . Coronary artery disease    NONOBSTRUCTIVE  . Gallbladder disease   . GERD (gastroesophageal reflux disease)   . Hyperlipidemia   . Migraine aura without headache   . Prostate cancer ([place])   . Seasonal allergies     Past Surgical History:  Procedure Laterality Date  . CARDIAC CATHETERIZATION  07/16/2006   EF 50-55%  . CARDIOVASCULAR STRESS TEST  07/08/2006   EF 57%  . CATARACT EXTRACTION, BILATERAL    . COLONOSCOPY  09/14/2008  . CYSTOSCOPY N/A 03/25/2019   Procedure: CYSTOSCOPY;  Surgeon: Ardis Hughs, MD;  Location: [facility];  Service: Urology;  Laterality: N/A;  No seeds detected in bladder per Dr. Louis Meckel  . LAPAROSCOPIC CHOLECYSTECTOMY  2008  . PROSTATE BIOPSY  2009  . PROSTATE BIOPSY  06/27/2012  . PROSTATE BIOPSY  06/08/2013  . PROSTATE BIOPSY  03/19/2016  . PROSTATE BIOPSY  04/17/2018  . RADIOACTIVE SEED IMPLANT N/A 03/25/2019   Procedure: RADIOACTIVE SEED IMPLANT/BRACHYTHERAPY IMPLANT;  Surgeon: Ardis Hughs, MD;  Location: [facility];   Service: Urology;  Laterality: N/A;  . SPACE OAR INSTILLATION N/A 03/25/2019   Procedure: SPACE OAR INSTILLATION;  Surgeon: Ardis Hughs, MD;  Location: [facility];  Service: Urology;  Laterality: N/A;    There were no vitals filed for this visit.   Subjective Assessment - 04/01/20 0936    Subjective After seeing [name] for last session has a fall off the back of a stool at [employer]. Had a Hemotoma at the area. Saw Dr. Vertell Limber who cleared the back and stated "best thing to do is not to do that again". Hemotoma has cleared up, does not hurt anymore. Was able to do a recent trip okay.    Pertinent History L4-5 microdisckectomy 01/05/2020; radiculopathy lumbar region, R foot drop, LBP with sciatica, lumbar herniated disk    Patient Stated Goals To get my balance back is my primary goal.    Currently in Pain? No/denies    Pain Score 0-No pain    Pain Onset In the past 7 days              [facility] PT Assessment - 04/01/20 0945      Standardized Balance Assessment   Standardized Balance Assessment Dynamic Gait Index;Timed Up and Go Test    Five times sit to stand comments  13.16 no UE support from standard height chair  Dynamic Gait Index   Level Surface Normal   7.12 sec's   Change in Gait Speed Mild Impairment    Gait with Horizontal Head Turns Mild Impairment    Gait with Vertical Head Turns Mild Impairment    Gait and Pivot Turn Normal    Step Over Obstacle Severe Impairment    Step Around Obstacles Mild Impairment    Steps Mild Impairment    Total Score 16    DGI comment: Scores <19/24 indicate increased fall risk.      Timed Up and Go Test   TUG Normal TUG    Normal TUG (seconds) 18.35   no device; 15.97 with cane, right leg tremors at times               [facility] Adult PT Treatment/Exercise - 04/01/20 0945      Transfers   Transfers Sit to Stand;Stand to Sit    Sit to Stand 5: Supervision;With upper extremity assist;From chair/3-in-1     Stand to Sit 5: Supervision;With upper extremity assist;To chair/3-in-1      Ambulation/Gait   Ambulation/Gait Yes    Ambulation/Gait Assistance 5: Supervision    Ambulation/Gait Assistance Details pt has his foot up brace on right foot with intermittent use of cane    Ambulation Distance (Feet) 210 Feet   x1, 200 x1 in/outdoors, plus around gym   Assistive device Straight cane;Other (Comment)    Gait Pattern Step-through pattern;Decreased step length - left;Decreased stance time - right;Decreased step length - right;Decreased dorsiflexion - right;Decreased weight shift to right;Ataxic;Poor foot clearance - right;Wide base of support    Ambulation Surface Level;Indoor    Gait velocity 10.84 sec's    Stairs Yes    Stairs Assistance 5: Supervision    Stairs Assistance Details (indicate cue type and reason) no balance issues noted, increased time needed with descent     Stair Management Technique One rail Right;Alternating pattern;Forwards    Number of Stairs 4    Height of Stairs 6    Curb 4: Min assist;3: Mod assist;Other (comment)   min guard assisst   Curb Details (indicate cue type and reason) block practice on outdoor curb with cane with min guard to min assist mostly. one episode of forward balance loss with descending curb needing mod assist to prevent fall.  Pt with right LE tremors at times causing him to loose his balance. When he keeps is right foot planted to allow the other foot to move first the tremors are not as bad                     PT Short Term Goals - 04/01/20 0941      PT SHORT TERM GOAL #1   Title Pt will be independent with HEP for improved strength, balance, transfers, and gait.  TARGET 03/11/2020    Baseline 04/01/20: met with current program, will benefit from updates as pt progresses    Time --    Period --    Status Achieved      PT SHORT TERM GOAL #2   Title Pt will improve TUG score to less than or equal to 13.5 seconds for decreased fall risk.     Baseline 04/01/20: 18.35 no device    Time --    Period --    Status Not Met      PT SHORT TERM GOAL #3   Title Pt will improve DGI score to at least 14/24 for decreased fall  risk.    Baseline 04/01/20: 16/24 scored today    Time --    Period --    Status Achieved      PT SHORT TERM GOAL #4   Title Pt will negotiate 4 steps, step through pattern, modified independently, for improved stair negotiation in the home.    Baseline 04/01/20: met in session with single rail support today    Time --    Period --    Status Achieved      PT SHORT TERM GOAL #5   Title Pt will improve SLS to at least 2 seconds on RLE for improved stair, obstacle negotiation.    Baseline 04/01/20: unable to assess due to time contraints    Time --    Period --    Status Unable to assess             PT Long Term Goals - 04/01/20 0943      PT LONG TERM GOAL #1   Title Pt will be independent with progression of HEP for improved balance, strength, and gait.  TARGET 04/08/2020    Baseline 04/01/20: met with current program, will benefit from updating as pt progresses    Time --    Period --    Status Partially Met      PT LONG TERM GOAL #2   Title Pt will perform at least 5 reps of sit<>stand with minimal to no UE support, no posterior lean or loss of balance.    Baseline 04/01/20: 13.16 sec's for 5 reps of sit to stand no UE support or posterior loss of balance    Time --    Period --    Status Achieved      PT LONG TERM GOAL #3   Title Pt will improve DGI score to at least 19/24 for decreased fall risk.    Baseline 04/01/20: 16/24 scored today, improved just not to goal level    Time --    Period --    Status Partially Met      PT LONG TERM GOAL #4   Title Pt will negotiate curb step, modified independently, for improved outdoor negotiation.    Baseline 04/01/20: pt continues to need up to mod assist at times with curbs with cane support due to tremors in right Le    Time --    Period --    Status  Not Met      PT LONG TERM GOAL #5   Title Pt will ambulate at least 1000 ft, indoor and outdoor surfaces, modifiied independently, with cane (vs no device), for improved outdoor and community gait.    Baseline 04/01/20: unable to assess full distance due to time contraints    Time --    Period --    Status Unable to assess                 Plan - 04/01/20 0940    Clinical Impression Statement Today's skilled session focused on progress toward STGs and LTGs for PT to recert to continue to address strengthening, gait/barriers and balance training. The pt has missed several visits due to fall with injury, followed by travel with spouse. The pt did demonstrate improved Dynamic Gait Index score of 16/24, the ability to perform 5 sit to stands without posterior balance loss and improved stair negotiation. His Timed Up and Go times increased both with and without AD from last assesment. The pt is progressing and should benefit from continued  PT to progress toward unmet goals.    Personal Factors and Comorbidities Comorbidity 3+    Comorbidities L4-5 microdisckectomy 01/05/2020; radiculopathy lumbar region, R foot drop, LBP with sciatica, lumbar herniated disk    Examination-Activity Limitations Locomotion Level;Transfers;Stairs;Stand    Examination-Participation Restrictions Community Activity;Yard Work    [employer] officer Evolving/Moderate complexity    Rehab Potential Good    PT Frequency 2x / week    PT Duration 8 weeks   including eval week   PT Treatment/Interventions ADLs/Self Care Home Management;Gait training;Functional mobility training;DME Instruction;Neuromuscular re-education;Balance training;Therapeutic exercise;Therapeutic activities;Patient/family education;Passive range of motion;Manual techniques;Stair training;Orthotic Fit/Training    PT Next Visit Plan PT to recert.  Add taps to step to his HEP to encourage SLS.  In next therapy session:  review HEP and continue  to work balance activities; compliant surfaces;    PT [place] and Agree with Plan of Care Patient           Patient will benefit from skilled therapeutic intervention in order to improve the following deficits and impairments:  Abnormal gait, Difficulty walking, Decreased range of motion, Decreased activity tolerance, Decreased balance, Impaired flexibility, Decreased mobility, Decreased strength, Decreased coordination  Visit Diagnosis: Other abnormalities of gait and mobility  Unsteadiness on feet  Muscle weakness (generalized)     Problem List Patient Active Problem List   Diagnosis Date Noted  . S/P lumbar discectomy 01/26/2020  . Former smoker 03/23/2019  . Prostate cancer ([place]) 05/10/2015  . History of colonic polyps 05/10/2015  . Premature atrial contractions 05/10/2015  . Hyperlipidemia 01/25/2011  . CAD (coronary artery disease), native coronary artery 01/25/2011    Willow Ora, PTA, [facility] [address], [place], [place] [address] [phone] 04/01/20, 4:37 PM   Name: Bernard Reed MRN: [id number] Date of Birth: September 10, 1945

## 2020-04-05 ENCOUNTER — Other Ambulatory Visit: Payer: Self-pay

## 2020-04-05 ENCOUNTER — Ambulatory Visit: Payer: PPO | Admitting: Physical Therapy

## 2020-04-05 DIAGNOSIS — M6281 Muscle weakness (generalized): Secondary | ICD-10-CM

## 2020-04-05 DIAGNOSIS — R2689 Other abnormalities of gait and mobility: Secondary | ICD-10-CM | POA: Diagnosis not present

## 2020-04-05 DIAGNOSIS — R2681 Unsteadiness on feet: Secondary | ICD-10-CM

## 2020-04-05 NOTE — Therapy (Signed)
[facility] [address] [place], [place], [address] Phone: [phone]   Fax:  [phone]  Physical Therapy Treatment  Patient Details  Name: Bernard Reed MRN: [id number] Date of Birth: 1946-01-28 Referring Provider (PT): Erline Levine, MD   Encounter Date: 04/05/2020   PT End of Session - 04/05/20 1039    Visit Number 7    Number of Visits 16    Date for PT Re-Evaluation 69/45/03   90 day cert for 8-wk POC (due to pt being out of town several times)   Authorization Type [employer]    Progress Note Due on Visit 10    PT Start Time [phone]    PT Stop Time 0932    PT Time Calculation (min) 39 min    Activity Tolerance Patient tolerated treatment well    Behavior During Therapy [facility] for tasks assessed/performed           Past Medical History:  Diagnosis Date   Coronary artery disease    NONOBSTRUCTIVE   Gallbladder disease    GERD (gastroesophageal reflux disease)    Hyperlipidemia    Migraine aura without headache    Prostate cancer ([place])    Seasonal allergies     Past Surgical History:  Procedure Laterality Date   CARDIAC CATHETERIZATION  07/16/2006   EF 50-55%   CARDIOVASCULAR STRESS TEST  07/08/2006   EF 57%   CATARACT EXTRACTION, BILATERAL     COLONOSCOPY  09/14/2008   CYSTOSCOPY N/A 03/25/2019   Procedure: CYSTOSCOPY;  Surgeon: Ardis Hughs, MD;  Location: [facility];  Service: Urology;  Laterality: N/A;  No seeds detected in bladder per Dr. Louis Meckel   LAPAROSCOPIC CHOLECYSTECTOMY  2008   PROSTATE BIOPSY  2009   PROSTATE BIOPSY  06/27/2012   PROSTATE BIOPSY  06/08/2013   PROSTATE BIOPSY  03/19/2016   PROSTATE BIOPSY  04/17/2018   RADIOACTIVE SEED IMPLANT N/A 03/25/2019   Procedure: RADIOACTIVE SEED IMPLANT/BRACHYTHERAPY IMPLANT;  Surgeon: Ardis Hughs, MD;  [facility]: [facility];  Service: Urology;  Laterality: N/A;   SPACE OAR  INSTILLATION N/A 03/25/2019   Procedure: SPACE OAR INSTILLATION;  Surgeon: Ardis Hughs, MD;  Location: [facility];  Service: Urology;  Laterality: N/A;    There were no vitals filed for this visit.   Subjective Assessment - 04/05/20 1028    Subjective No changes since last visit.  No falls.  Absolutely no pain.    Pertinent History L4-5 microdisckectomy 01/05/2020; radiculopathy lumbar region, R foot drop, LBP with sciatica, lumbar herniated disk    Patient Stated Goals To get my balance back is my primary goal.    Currently in Pain? No/denies    Pain Onset In the past 7 days              Neuro Re-education   Reviewed Exercises as part of HEP (made updates/progressed as noted below)   Standing Single Leg Stance with Counter Support - 1 x daily - 5 x weekly - 1 sets - 3 reps - 10 second hold o Pt initially performs BUE support, PT provides cues to go down to 1 UE support and cues for looking ahead at visual target (not looking down)-updated HEP to go down to 1 UE support  Standing March - 1 x daily - 5 x weekly - 1 sets - 3 reps (did not perform today, but pt verbally reports doing at home)  Heel rises with counter  support - 1 x daily - 5 x weekly - 3 sets - 10 reps o Performed today x 10 reps (pt reports performing full 3 sets at home).  Cues for 3 second hold time   Toe Walking with Counter Support - 1 x daily - 5 x weekly - 1 sets - 3 reps o Performed along counter, 4 reps, with significant difficulty with RLE tremor noted, difficulty with foot placement up on toes (d/cd this exercise due to safety concerns)  Standing Tandem Balance with Counter Support - 1 x daily - 5 x weekly - 3 sets - 1 reps - 10 second hold o Performed x 2 today, and PT provided cues to go down to 1 UE support with cues for visual target  Tandem Walking with Counter Support - 1 x daily - 5 x weekly - 1 sets - 3 reps o Performed 2 reps with UE support-cues to look ahead at visual  target  Sit to stand in stride stance - 1-2 x daily - 7 x weekly - 2-3 sets - 10 reps o Performed initial sit<>stand with feet shoulder width apart (which pt demo is too easy), so progressed to stride stance, with RLE in posterior position. o Cues for technique and increased weightbearing through RLE  Also performed additional exercises to attempt to engage ankle musculature for balance:  -single limb heel raises, RLE only x 5 reps  -attempted toe raises, pt unable in standing -stagger stance forward/back rocking x 10 reps each foot position, with PT providing manual cues when RLE in in front position, when he attempts to rock back to raise toes on R foot (foot wants to raise up from the floor and unweight)  -alternating heel raises x 10 reps at counter, manual cues for assist RLE               [facility] Adult PT Treatment/Exercise - 04/05/20 0001      Ambulation/Gait   Ambulation/Gait Yes    Ambulation/Gait Assistance 5: Supervision    Ambulation/Gait Assistance Details Wearing foot-up brace on RLE.     Ambulation Distance (Feet) 230 Feet   x 2; 80 ft x 2, 40 ft x 2   Assistive device Straight cane;Other (Comment)   small prong base; foot-up brace RLE   Gait Pattern Step-through pattern;Decreased step length - left;Decreased stance time - right;Decreased step length - right;Decreased dorsiflexion - right;Decreased weight shift to right;Ataxic;Poor foot clearance - right;Wide base of support    Ambulation Surface Level;Indoor    Gait Comments Initial cues for increased step length/heelstrike RLE                  PT Education - 04/05/20 1038    Education Details Progressions/updates made to HEP (provided pt with new printout of entire packet)    Person(s) Educated Patient    Methods Explanation;Demonstration;Handout;Verbal cues    Comprehension Verbalized understanding;Returned demonstration               PT Long Term Goals - 04/05/20 1046      PT LONG TERM GOAL  #1   Title Pt will be independent with progression of HEP for improved balance, strength, and gait.  TARGET 04/29/2020    Baseline 04/01/20: met with current program, will benefit from updating as pt progresses    Time 8    Period Weeks    Status On-going      PT LONG TERM GOAL #2   Title Pt will improve TUG  score to less than or equal to 13.5 sec for decreased fall risk.    Time 8    Period Weeks    Status Revised      PT LONG TERM GOAL #3   Title Pt will improve DGI score to at least 19/24 for decreased fall risk.    Baseline 04/01/20: 16/24 scored today, improved just not to goal level    Time 8    Status On-going      PT LONG TERM GOAL #4   Title Pt will negotiate curb step, modified independently, for improved outdoor negotiation.    Baseline 04/01/20: pt continues to need up to mod assist at times with curbs with cane support due to tremors in right Le    Time 8    Period Weeks    Status On-going      PT LONG TERM GOAL #5   Title Pt will ambulate at least 1000 ft, indoor and outdoor surfaces, modifiied independently, with cane (vs no device), for improved outdoor and community gait.    Baseline 04/01/20: unable to assess full distance due to time contraints    Time 8    Period Weeks    Status On-going                 Plan - 04/05/20 1041    Clinical Impression Statement In looking at chart and discussing with pt/PTA last visit, pt does not need recert completed, as initial POC was for 90 days.  Current and future visits covered by current cert, and PT updated LTGs this visit to reflect measures taken last visit.  Pt appears to have decreased coordination and timing associated with RLE (and at times RUE), with tremors noted RLE with intentional movements with toe raises activity.  He is wearing the footup brace on the R, which appears to help with RLE foot clearance, but gait is slowed with head motions and environmental scanning.  He will benefit from continued skilled PT  to further address gait, strength, balance for overall improved functional mobility and independence.    Personal Factors and Comorbidities Comorbidity 3+    Comorbidities L4-5 microdisckectomy 01/05/2020; radiculopathy lumbar region, R foot drop, LBP with sciatica, lumbar herniated disk    Examination-Activity Limitations Locomotion Level;Transfers;Stairs;Stand    Examination-Participation Restrictions Community Activity;Yard Work    Merchant navy officer Evolving/Moderate complexity    Rehab Potential Good    PT Frequency 2x / week    PT Duration 8 weeks   including eval week   PT Treatment/Interventions ADLs/Self Care Home Management;Gait training;Functional mobility training;DME Instruction;Neuromuscular re-education;Balance training;Therapeutic exercise;Therapeutic activities;Patient/family education;Passive range of motion;Manual techniques;Stair training;Orthotic Fit/Training    PT Next Visit Plan PT updated LTGs; please review HEP additions/progression given this visit.  Compliant surfaces, head motions, balance and dynamic gait activities    PT Home Exercise Plan [facility] [email or web address]    Consulted and Agree with Plan of Care Patient           Patient will benefit from skilled therapeutic intervention in order to improve the following deficits and impairments:  Abnormal gait, Difficulty walking, Decreased range of motion, Decreased activity tolerance, Decreased balance, Impaired flexibility, Decreased mobility, Decreased strength, Decreased coordination  Visit Diagnosis: Other abnormalities of gait and mobility  Unsteadiness on feet  Muscle weakness (generalized)     Problem List Patient Active Problem List   Diagnosis Date Noted   S/P lumbar discectomy 01/26/2020   Former smoker 03/23/2019   Prostate cancer ([place]) 05/10/2015  History of colonic polyps 05/10/2015   Premature atrial contractions 05/10/2015   Hyperlipidemia 01/25/2011   CAD (coronary artery  disease), native coronary artery 01/25/2011    Bernard Lastinger W. 04/05/2020, 10:49 AM  Frazier Butt., PT   [facility] [address] [place], [place], [address] Phone: [phone]   Fax:  [phone]  Name: Bernard Reed MRN: [id number] Date of Birth: [birth year]

## 2020-04-05 NOTE — Patient Instructions (Signed)
Access Code: [email or web address] URL: [email or web address] Date: 04/05/2020 Prepared by: Mady Haagensen  Exercises Standing Single Leg Stance with Counter Support - 1 x daily - 5 x weekly - 1 sets - 3 reps - 10 second hold Standing March - 1 x daily - 5 x weekly - 1 sets - 3 reps Heel rises with counter support - 1 x daily - 5 x weekly - 3 sets - 10 reps Standing Tandem Balance with Counter Support - 1 x daily - 5 x weekly - 3 sets - 1 reps - 10 second hold Tandem Walking with Counter Support - 1 x daily - 5 x weekly - 1 sets - 3 reps Sit to stand in stride stance - 1-2 x daily - 7 x weekly - 2-3 sets - 10 reps Alternating Step Taps with Counter Support - 1 x daily - 7 x weekly - 3 sets - 10 reps

## 2020-04-07 ENCOUNTER — Ambulatory Visit: Payer: PPO | Admitting: Physical Therapy

## 2020-04-07 ENCOUNTER — Other Ambulatory Visit: Payer: Self-pay

## 2020-04-07 DIAGNOSIS — R2681 Unsteadiness on feet: Secondary | ICD-10-CM

## 2020-04-07 DIAGNOSIS — R2689 Other abnormalities of gait and mobility: Secondary | ICD-10-CM | POA: Diagnosis not present

## 2020-04-07 NOTE — Therapy (Signed)
[facility] [address] [place], [place], [address] Phone: [phone]   Fax:  [phone]  Physical Therapy Treatment  Patient Details  Name: Bernard Reed MRN: [id number] Date of Birth: 06-Nov-1945 Referring Provider (PT): Erline Levine, MD   Encounter Date: 04/07/2020   PT End of Session - 04/07/20 0806    Visit Number 8    Number of Visits 16    Date for PT Re-Evaluation 34/28/76   90 day cert for 8-wk POC (due to pt being out of town several times)   Authorization Type [employer]    Progress Note Due on Visit 10    PT Start Time 0805    PT Stop Time 0845    PT Time Calculation (min) 40 min    Activity Tolerance Patient tolerated treatment well    Behavior During Therapy [facility] for tasks assessed/performed           Past Medical History:  Diagnosis Date  . Coronary artery disease    NONOBSTRUCTIVE  . Gallbladder disease   . GERD (gastroesophageal reflux disease)   . Hyperlipidemia   . Migraine aura without headache   . Prostate cancer ([place])   . Seasonal allergies     Past Surgical History:  Procedure Laterality Date  . CARDIAC CATHETERIZATION  07/16/2006   EF 50-55%  . CARDIOVASCULAR STRESS TEST  07/08/2006   EF 57%  . CATARACT EXTRACTION, BILATERAL    . COLONOSCOPY  09/14/2008  . CYSTOSCOPY N/A 03/25/2019   Procedure: CYSTOSCOPY;  Surgeon: Ardis Hughs, MD;  Location: [facility];  Service: Urology;  Laterality: N/A;  No seeds detected in bladder per Dr. Louis Meckel  . LAPAROSCOPIC CHOLECYSTECTOMY  2008  . PROSTATE BIOPSY  2009  . PROSTATE BIOPSY  06/27/2012  . PROSTATE BIOPSY  06/08/2013  . PROSTATE BIOPSY  03/19/2016  . PROSTATE BIOPSY  04/17/2018  . RADIOACTIVE SEED IMPLANT N/A 03/25/2019   Procedure: RADIOACTIVE SEED IMPLANT/BRACHYTHERAPY IMPLANT;  Surgeon: Ardis Hughs, MD;  Location: [facility];  Service: Urology;  Laterality: N/A;  . SPACE OAR  INSTILLATION N/A 03/25/2019   Procedure: SPACE OAR INSTILLATION;  Surgeon: Ardis Hughs, MD;  Location: [facility];  Service: Urology;  Laterality: N/A;    There were no vitals filed for this visit.   Subjective Assessment - 04/07/20 0806    Subjective No changes, no falls, no pain.    Pertinent History L4-5 microdisckectomy 01/05/2020; radiculopathy lumbar region, R foot drop, LBP with sciatica, lumbar herniated disk    Patient Stated Goals To get my balance back is my primary goal.    Currently in Pain? No/denies    Pain Onset In the past 7 days                     Reviewed HEP additions/updates from last visit-pt return demo understanding.  Pt does good job of self-correcting posture and looking ahead at target.  Standing Single Leg Stance with Counter Support - 1 x daily - 5 x weekly - 1 sets - 3 reps - 10 second hold Standing March - 1 x daily - 5 x weekly - 1 sets - 3 reps Heel rises with counter support - 1 x daily - 5 x weekly - 3 sets - 10 reps Standing Tandem Balance with Counter Support - 1 x daily - 5 x weekly - 3 sets - 1 reps - 10 second hold Tandem Walking  with Counter Support - 1 x daily - 5 x weekly - 1 sets - 3 reps Sit to stand in stride stance - 1-2 x daily - 7 x weekly - 2-3 sets - 10 reps Alternating Step Taps with Counter Support - 1 x daily - 7 x weekly - 3 sets - 10 reps                Balance Exercises - 04/07/20 0001      Balance Exercises: Standing   Standing Eyes Opened Wide (BOA);Foam/compliant surface;5 reps;Narrow base of support (BOS)   Head nods   Standing Eyes Closed Wide (BOA);Foam/compliant surface;Narrow base of support (BOS);5 reps;Head turns   Head nods   Stepping Strategy Anterior;Posterior;Lateral;Foam/compliant surface;UE support;10 reps;Limitations    Stepping Strategy Limitations Cues for increased step length and foot clearance on RLE    Gait with Head Turns Forward;2 reps   With cane, 20 ft x 2;  then head nods 20 ft x 1   Step Over Hurdles / Cones Over 20 ft distance, using cane, stepping over hurdles, 2" black obstacle, with extra time, leading RLE with min guard/supervision.    Other Standing Exercises Stagger stance anterior/posterior weigthshfiting x 15 reps each position, with initial tactile and verbal cues for technique    Other Standing Exercises Comments Standing on [employer]ire[employer]:  minisquats x 10 reps; forward<>back step and weightshift x 10 reps with 1 UE support.  Cues for increased RLE step length and excursion               PT Short Term Goals - 04/01/20 0941      PT SHORT TERM GOAL #1   Title Pt will be independent with HEP for improved strength, balance, transfers, and gait.  TARGET 03/11/2020    Baseline 04/01/20: met with current program, will benefit from updates as pt progresses    Time --    Period --    Status Achieved      PT SHORT TERM GOAL #2   Title Pt will improve TUG score to less than or equal to 13.5 seconds for decreased fall risk.    Baseline 04/01/20: 18.35 no device    Time --    Period --    Status Not Met      PT SHORT TERM GOAL #3   Title Pt will improve DGI score to at least 14/24 for decreased fall risk.    Baseline 04/01/20: 16/24 scored today    Time --    Period --    Status Achieved      PT SHORT TERM GOAL #4   Title Pt will negotiate 4 steps, step through pattern, modified independently, for improved stair negotiation in the home.    Baseline 04/01/20: met in session with single rail support today    Time --    Period --    Status Achieved      PT SHORT TERM GOAL #5   Title Pt will improve SLS to at least 2 seconds on RLE for improved stair, obstacle negotiation.    Baseline 04/01/20: unable to assess due to time contraints    Time --    Period --    Status Unable to assess             PT Long Term Goals - 04/05/20 1046      PT LONG TERM GOAL #1   Title Pt will be independent with progression of HEP for improved balance,  strength, and  gait.  TARGET 04/29/2020    Baseline 04/01/20: met with current program, will benefit from updating as pt progresses    Time 8    Period Weeks    Status On-going      PT LONG TERM GOAL #2   Title Pt will improve TUG score to less than or equal to 13.5 sec for decreased fall risk.    Time 8    Period Weeks    Status Revised      PT LONG TERM GOAL #3   Title Pt will improve DGI score to at least 19/24 for decreased fall risk.    Baseline 04/01/20: 16/24 scored today, improved just not to goal level    Time 8    Status On-going      PT LONG TERM GOAL #4   Title Pt will negotiate curb step, modified independently, for improved outdoor negotiation.    Baseline 04/01/20: pt continues to need up to mod assist at times with curbs with cane support due to tremors in right Le    Time 8    Period Weeks    Status On-going      PT LONG TERM GOAL #5   Title Pt will ambulate at least 1000 ft, indoor and outdoor surfaces, modifiied independently, with cane (vs no device), for improved outdoor and community gait.    Baseline 04/01/20: unable to assess full distance due to time contraints    Time 8    Period Weeks    Status On-going                 Plan - 04/07/20 1512    Clinical Impression Statement Reviewed updates to HEP given last visit, with pt return demo understanding.  Worked on step and weightshifting activities and compliant surfaces, with pt needing cues for increased step length/foot clearance RLE.  He will continue to beneift from skilled PT to address gait, balance, strenght for imrpoved overall mobility.    Personal Factors and Comorbidities Comorbidity 3+    Comorbidities L4-5 microdisckectomy 01/05/2020; radiculopathy lumbar region, R foot drop, LBP with sciatica, lumbar herniated disk    Examination-Activity Limitations Locomotion Level;Transfers;Stairs;Stand    Examination-Participation Restrictions Community Activity;Yard Work    Multimedia programmer Evolving/Moderate complexity    Rehab Potential Good    PT Frequency 2x / week    PT Duration 8 weeks   including eval week   PT Treatment/Interventions ADLs/Self Care Home Management;Gait training;Functional mobility training;DME Instruction;Neuromuscular re-education;Balance training;Therapeutic exercise;Therapeutic activities;Patient/family education;Passive range of motion;Manual techniques;Stair training;Orthotic Fit/Training    PT Next Visit Plan Compliant surfaces, step strategy, work head motions, balance and dynamic gait activities    PT Home Exercise Plan [place] and Agree with Plan of Care Patient           Patient will benefit from skilled therapeutic intervention in order to improve the following deficits and impairments:  Abnormal gait, Difficulty walking, Decreased range of motion, Decreased activity tolerance, Decreased balance, Impaired flexibility, Decreased mobility, Decreased strength, Decreased coordination  Visit Diagnosis: Unsteadiness on feet     Problem List Patient Active Problem List   Diagnosis Date Noted  . S/P lumbar discectomy 01/26/2020  . Former smoker 03/23/2019  . Prostate cancer ([place]) 05/10/2015  . History of colonic polyps 05/10/2015  . Premature atrial contractions 05/10/2015  . Hyperlipidemia 01/25/2011  . CAD (coronary artery disease), native coronary artery 01/25/2011    Elenie Coven W. 04/07/2020, 3:15 PM  Maigen Mozingo  Viona Gilmore PT   [facility] [address] [place], [place], [address] Phone: [phone]   Fax:  [phone]  Name: Jammy Plotkin MRN: [id number] Date of Birth: [birth year]

## 2020-04-13 ENCOUNTER — Encounter: Payer: Self-pay | Admitting: Physical Therapy

## 2020-04-13 ENCOUNTER — Ambulatory Visit: Payer: PPO | Admitting: Physical Therapy

## 2020-04-13 ENCOUNTER — Other Ambulatory Visit: Payer: Self-pay

## 2020-04-13 DIAGNOSIS — M6281 Muscle weakness (generalized): Secondary | ICD-10-CM

## 2020-04-13 DIAGNOSIS — R2689 Other abnormalities of gait and mobility: Secondary | ICD-10-CM

## 2020-04-13 DIAGNOSIS — R2681 Unsteadiness on feet: Secondary | ICD-10-CM

## 2020-04-13 NOTE — Therapy (Signed)
[facility] [address] [place], [place], [address] Phone: [phone]   Fax:  [phone]  Physical Therapy Treatment  Patient Details  Name: Bernard Reed MRN: [id number] Date of Birth: 11/10/1945 Referring Provider (PT): Joseph Stern, MD   Encounter Date: 04/13/2020   PT End of Session - 04/13/20 1021    Visit Number 9    Number of Visits 16    Date for PT Re-Evaluation 05/15/20   90 day cert for 8-wk POC (due to pt being out of town several times)   Authorization Type [employer]    Progress Note Due on Visit 10    PT Start Time 1018    PT Stop Time 1100    PT Time Calculation (min) 42 min    Equipment Utilized During Treatment Gait belt    Activity Tolerance Patient tolerated treatment well    Behavior During Therapy WFL for tasks assessed/performed           Past Medical History:  Diagnosis Date  . Coronary artery disease    NONOBSTRUCTIVE  . Gallbladder disease   . GERD (gastroesophageal reflux disease)   . Hyperlipidemia   . Migraine aura without headache   . Prostate cancer (HCC)   . Seasonal allergies     Past Surgical History:  Procedure Laterality Date  . CARDIAC CATHETERIZATION  07/16/2006   EF 50-55%  . CARDIOVASCULAR STRESS TEST  07/08/2006   EF 57%  . CATARACT EXTRACTION, BILATERAL    . COLONOSCOPY  09/14/2008  . CYSTOSCOPY N/A 03/25/2019   Procedure: CYSTOSCOPY;  Surgeon: Herrick, Benjamin W, MD;  Location: [facility];  Service: Urology;  Laterality: N/A;  No seeds detected in bladder per Dr. Herrick  . LAPAROSCOPIC CHOLECYSTECTOMY  2008  . PROSTATE BIOPSY  2009  . PROSTATE BIOPSY  06/27/2012  . PROSTATE BIOPSY  06/08/2013  . PROSTATE BIOPSY  03/19/2016  . PROSTATE BIOPSY  04/17/2018  . RADIOACTIVE SEED IMPLANT N/A 03/25/2019   Procedure: RADIOACTIVE SEED IMPLANT/BRACHYTHERAPY IMPLANT;  Surgeon: Herrick, Benjamin W, MD;  Location: [facility];   Service: Urology;  Laterality: N/A;  . SPACE OAR INSTILLATION N/A 03/25/2019   Procedure: SPACE OAR INSTILLATION;  Surgeon: Herrick, Benjamin W, MD;  Location: [facility];  Service: Urology;  Laterality: N/A;    There were no vitals filed for this visit.   Subjective Assessment - 04/13/20 1020    Subjective No new complaints. Was out of town with his [place] for [place] where he did a lot of walking. Was not able to do the ex's while out of state. No falls or pain to report.    Pertinent History L4-5 microdisckectomy 01/05/2020; radiculopathy lumbar region, R Reed drop, LBP with sciatica, lumbar herniated disk    Patient Stated Goals To get my balance back is my primary goal.    Currently in Pain? No/denies    Pain Score 0-No pain                 [facility] Adult PT Treatment/Exercise - 04/13/20 1022      Transfers   Transfers Sit to Stand;Stand to Sit    Sit to Stand 5: Supervision;With upper extremity assist;From chair/3-in-1    Stand to Sit 5: Supervision;With upper extremity assist;To chair/3-in-1      Ambulation/Gait   Ambulation/Gait Yes    Ambulation/Gait Assistance 5: Supervision;4: Min guard    Ambulation/Gait Assistance Details one instance of right Reed   catching with pt self correcting. mild veering noted at times when pt scanned enviroment. increased Reed slap on right LE noted as gait progressed as well.     Ambulation Distance (Feet) 1000 Feet    Assistive device Straight cane;Other (Comment)   Reed up brace   Gait Pattern Step-through pattern;Decreased step length - left;Decreased stance time - right;Decreased step length - right;Decreased dorsiflexion - right;Decreased weight shift to right;Ataxic;Poor Reed clearance - right;Wide base of support    Ambulation Surface Level;Unlevel;Indoor;Outdoor;Paved    Curb Other (comment);4: Min assist   min guard assist   Curb Details (indicate cue type and reason) bloc practice with aerobic curb step with cane  only, no other UE support working on curb negotation. pt ascends with left Reed first and descends with left Reed first. this allows his right leg to stay weight with movement initation with decreased tremors with right LE. min guard to min assist for balance.       High Level Balance   High Level Balance Activities Side stepping;Marching backwards;Marching forwards;Tandem walking   tandem fwd/bwd   High Level Balance Comments on red/blue mats next to counter top: 3 laps each/each way with light UE support on counter for balance with min guard to min assist from PTA. cues on posture, weight shifitng and ex form/technique.                Balance Exercises - 04/13/20 1048      Balance Exercises: Standing   Standing Eyes Closed Narrow base of support (BOS);Wide (BOA);Head turns;Foam/compliant surface;Other reps (comment);30 secs;Limitations    Standing Eyes Closed Limitations on airex no UE support: feet together for EC 30 sec holds, progressing to feet hip width apart for EC head movements left<>right, up<>down. min assist due to increaased postural sway with posterior preference. cues for weight shifting.                PT Short Term Goals - 04/01/20 0941      PT SHORT TERM GOAL #1   Title Pt will be independent with HEP for improved strength, balance, transfers, and gait.  TARGET 03/11/2020    Baseline 04/01/20: met with current program, will benefit from updates as pt progresses    Time --    Period --    Status Achieved      PT SHORT TERM GOAL #2   Title Pt will improve TUG score to less than or equal to 13.5 seconds for decreased fall risk.    Baseline 04/01/20: 18.35 no device    Time --    Period --    Status Not Met      PT SHORT TERM GOAL #3   Title Pt will improve DGI score to at least 14/24 for decreased fall risk.    Baseline 04/01/20: 16/24 scored today    Time --    Period --    Status Achieved      PT SHORT TERM GOAL #4   Title Pt will negotiate 4 steps,  step through pattern, modified independently, for improved stair negotiation in the home.    Baseline 04/01/20: met in session with single rail support today    Time --    Period --    Status Achieved      PT SHORT TERM GOAL #5   Title Pt will improve SLS to at least 2 seconds on RLE for improved stair, obstacle negotiation.    Baseline 04/01/20: unable to assess due to time  contraints    Time --    Period --    Status Unable to assess             PT Long Term Goals - 04/05/20 1046      PT LONG TERM GOAL #1   Title Pt will be independent with progression of HEP for improved balance, strength, and gait.  TARGET 04/29/2020    Baseline 04/01/20: met with current program, will benefit from updating as pt progresses    Time 8    Period Weeks    Status On-going      PT LONG TERM GOAL #2   Title Pt will improve TUG score to less than or equal to 13.5 sec for decreased fall risk.    Time 8    Period Weeks    Status Revised      PT LONG TERM GOAL #3   Title Pt will improve DGI score to at least 19/24 for decreased fall risk.    Baseline 04/01/20: 16/24 scored today, improved just not to goal level    Time 8    Status On-going      PT LONG TERM GOAL #4   Title Pt will negotiate curb step, modified independently, for improved outdoor negotiation.    Baseline 04/01/20: pt continues to need up to mod assist at times with curbs with cane support due to tremors in right Le    Time 8    Period Weeks    Status On-going      PT LONG TERM GOAL #5   Title Pt will ambulate at least 1000 ft, indoor and outdoor surfaces, modifiied independently, with cane (vs no device), for improved outdoor and community gait.    Baseline 04/01/20: unable to assess full distance due to time contraints    Time 8    Period Weeks    Status On-going                 Plan - 04/13/20 1021    Clinical Impression Statement Today's skilled session continued to focus on gait on various surfaces and balance  reactions on compliant surfaces. Up to min assist needed with pt demoing a posterior balance loss most times. The pt is progressing toward goals and should benefit from continued PT to progress toward unmet goals.    Personal Factors and Comorbidities Comorbidity 3+    Comorbidities L4-5 microdisckectomy 01/05/2020; radiculopathy lumbar region, R Reed drop, LBP with sciatica, lumbar herniated disk    Examination-Activity Limitations Locomotion Level;Transfers;Stairs;Stand    Examination-Participation Restrictions Community Activity;Yard Work    Stability/Clinical Decision Making Evolving/Moderate complexity    Rehab Potential Good    PT Frequency 2x / week    PT Duration 8 weeks   including eval week   PT Treatment/Interventions ADLs/Self Care Home Management;Gait training;Functional mobility training;DME Instruction;Neuromuscular re-education;Balance training;Therapeutic exercise;Therapeutic activities;Patient/family education;Passive range of motion;Manual techniques;Stair training;Orthotic Fit/Training    PT Next Visit Plan Compliant surfaces, step strategy, work head motions, balance and dynamic gait activities    PT Home Exercise Plan MedBridge [email or web address]    Consulted and Agree with Plan of Care Patient           Patient will benefit from skilled therapeutic intervention in order to improve the following deficits and impairments:  Abnormal gait, Difficulty walking, Decreased range of motion, Decreased activity tolerance, Decreased balance, Impaired flexibility, Decreased mobility, Decreased strength, Decreased coordination  Visit Diagnosis: Unsteadiness on feet  Other abnormalities of gait and mobility    Muscle weakness (generalized)     Problem List Patient Active Problem List   Diagnosis Date Noted  . S/P lumbar discectomy 01/26/2020  . Former smoker 03/23/2019  . Prostate cancer (HCC) 05/10/2015  . History of colonic polyps 05/10/2015  . Premature atrial contractions  05/10/2015  . Hyperlipidemia 01/25/2011  . CAD (coronary artery disease), native coronary artery 01/25/2011    Kathy Bury, PTA, CLT [facility] [address], [address] [place], [place] [address] [phone] 04/13/20, 11:54 AM    Name: Bernard Reed MRN: [id number] Date of Birth: 08/21/1945   

## 2020-04-15 ENCOUNTER — Ambulatory Visit: Payer: PPO | Admitting: Physical Therapy

## 2020-04-15 ENCOUNTER — Other Ambulatory Visit: Payer: Self-pay

## 2020-04-15 DIAGNOSIS — R2689 Other abnormalities of gait and mobility: Secondary | ICD-10-CM

## 2020-04-15 DIAGNOSIS — R2681 Unsteadiness on feet: Secondary | ICD-10-CM

## 2020-04-15 NOTE — Therapy (Signed)
[facility] [address] [place], [place], [address] Phone: [phone]   Fax:  [phone]  Physical Therapy Treatment/10th Visit Progress Note  Patient Details  Name: Akshaj Besancon MRN: [id number] Date of Birth: 01-04-1946 Referring Provider (PT): Erline Levine, MD   Encounter Date: 04/15/2020   PT End of Session - 04/15/20 1451    Visit Number 10    Number of Visits 16    Date for PT Re-Evaluation 13/24/40   90 day cert for 8-wk POC (due to pt being out of town several times)   Authorization Type [employer]    Progress Note Due on Visit 10    PT Start Time 1101    PT Stop Time 1145    PT Time Calculation (min) 44 min    Equipment Utilized During Treatment Gait belt    Activity Tolerance Patient tolerated treatment well    Behavior During Therapy [facility] for tasks assessed/performed           Past Medical History:  Diagnosis Date  . Coronary artery disease    NONOBSTRUCTIVE  . Gallbladder disease   . GERD (gastroesophageal reflux disease)   . Hyperlipidemia   . Migraine aura without headache   . Prostate cancer ([place])   . Seasonal allergies     Past Surgical History:  Procedure Laterality Date  . CARDIAC CATHETERIZATION  07/16/2006   EF 50-55%  . CARDIOVASCULAR STRESS TEST  07/08/2006   EF 57%  . CATARACT EXTRACTION, BILATERAL    . COLONOSCOPY  09/14/2008  . CYSTOSCOPY N/A 03/25/2019   Procedure: CYSTOSCOPY;  Surgeon: Ardis Hughs, MD;  Location: [facility];  Service: Urology;  Laterality: N/A;  No seeds detected in bladder per Dr. Louis Meckel  . LAPAROSCOPIC CHOLECYSTECTOMY  2008  . PROSTATE BIOPSY  2009  . PROSTATE BIOPSY  06/27/2012  . PROSTATE BIOPSY  06/08/2013  . PROSTATE BIOPSY  03/19/2016  . PROSTATE BIOPSY  04/17/2018  . RADIOACTIVE SEED IMPLANT N/A 03/25/2019   Procedure: RADIOACTIVE SEED IMPLANT/BRACHYTHERAPY IMPLANT;  Surgeon: Ardis Hughs, MD;  Location:  [facility];  Service: Urology;  Laterality: N/A;  . SPACE OAR INSTILLATION N/A 03/25/2019   Procedure: SPACE OAR INSTILLATION;  Surgeon: Ardis Hughs, MD;  Location: [facility];  Service: Urology;  Laterality: N/A;    There were no vitals filed for this visit.   Subjective Assessment - 04/15/20 1106    Subjective My question is what is the neuropathy?  I would like to follow up with my doctor about that.    Pertinent History L4-5 microdisckectomy 01/05/2020; radiculopathy lumbar region, R foot drop, LBP with sciatica, lumbar herniated disk    Patient Stated Goals To get my balance back is my primary goal.                             [facility] Adult PT Treatment/Exercise - 04/15/20 0001      Transfers   Transfers Sit to Stand;Stand to Sit    Sit to Stand 5: Supervision;With upper extremity assist;From chair/3-in-1;From bed    Stand to Sit 5: Supervision;With upper extremity assist;To chair/3-in-1;To bed    Comments Sit<>stand with RLE tucked posteriorly x 5 reps for improved RLE weightbearing.  Cues for lateral weigthshifting upon standing to prepare for large initial step.      Ambulation/Gait   Ambulation/Gait Yes    Ambulation/Gait Assistance 5: Supervision;4:  Min guard    Assistive device Straight cane;Other (Comment)   foot-up brace   Gait Pattern Step-through pattern;Decreased step length - left;Decreased stance time - right;Decreased step length - right;Decreased dorsiflexion - right;Decreased weight shift to right;Ataxic;Poor foot clearance - right;Wide base of support    Ambulation Surface Level;Indoor    Curb Other (comment)   min guard assistance   Curb Details (indicate cue type and reason) Practiced x 4 reps, using cane at curb step in gym, with cues to lead with LLE ascneding, lead with LLE descending.  Cues for correct cane placement.  Trialed x 1 with RLE leading descending and pt's RLE tremors with decreased control noted  to step to floor.      Self-Care   Self-Care Other Self-Care Comments    Other Self-Care Comments  Pt has multiple questions about neuropathy, how his RLE has episodes of sticking to the floor with standing, sticking to the floor with narrow spaces, reported shuffling in small spaces.  PT advised pt to follow up with questions to Dr. Vertell Limber and to possibly ask about return visit to neurologist to explain his symtpoms in more detail, as these symptoms do not present as typical symptoms following back surgery.               Balance Exercises - 04/15/20 0001      Balance Exercises: Standing   Standing Eyes Opened Wide (BOA);Foam/compliant surface;5 reps;Head turns;Narrow base of support (BOS)   Head nods   Standing Eyes Closed Wide (BOA);Narrow base of support (BOS);Head turns;Foam/compliant surface;5 reps   Head nods   Standing Eyes Closed Limitations On pillows in corner    Wall Bumps Hip;Eyes opened;10 reps;Limitations    Wall Bumps Limitations Multi-modal cues/demonstration for correct completion    Stepping Strategy Anterior;Posterior;Lateral;UE support;10 reps;Limitations    Stepping Strategy Limitations Cues for step length/height for improved foot clearance    Gait with Head Turns Forward;2 reps;Upper extremity support   Head nods x 2 reps, along counter   Partial Tandem Stance Eyes open;Foam/compliant surface;5 reps   Head turns, head nods   Retro Gait 3 reps;Upper extremity support   Forward/back walking at [employer] [employer] Solid surface;Upper extremity assist 1;Dynamic;Forwards;Retro   3 reps along counter   Heel Raises Both;10 reps    Toe Raise 10 reps;Both               PT Short Term Goals - 04/01/20 0941      PT SHORT TERM GOAL #1   Title Pt will be independent with HEP for improved strength, balance, transfers, and gait.  TARGET 03/11/2020    Baseline 04/01/20: met with current program, will benefit from updates as pt progresses    Time --    Period --    Status  Achieved      PT SHORT TERM GOAL #2   Title Pt will improve TUG score to less than or equal to 13.5 seconds for decreased fall risk.    Baseline 04/01/20: 18.35 no device    Time --    Period --    Status Not Met      PT SHORT TERM GOAL #3   Title Pt will improve DGI score to at least 14/24 for decreased fall risk.    Baseline 04/01/20: 16/24 scored today    Time --    Period --    Status Achieved      PT SHORT TERM GOAL #4   Title Pt will  negotiate 4 steps, step through pattern, modified independently, for improved stair negotiation in the home.    Baseline 04/01/20: met in session with single rail support today    Time --    Period --    Status Achieved      PT SHORT TERM GOAL #5   Title Pt will improve SLS to at least 2 seconds on RLE for improved stair, obstacle negotiation.    Baseline 04/01/20: unable to assess due to time contraints    Time --    Period --    Status Unable to assess             PT Long Term Goals - 04/05/20 1046      PT LONG TERM GOAL #1   Title Pt will be independent with progression of HEP for improved balance, strength, and gait.  TARGET 04/29/2020    Baseline 04/01/20: met with current program, will benefit from updating as pt progresses    Time 8    Period Weeks    Status On-going      PT LONG TERM GOAL #2   Title Pt will improve TUG score to less than or equal to 13.5 sec for decreased fall risk.    Time 8    Period Weeks    Status Revised      PT LONG TERM GOAL #3   Title Pt will improve DGI score to at least 19/24 for decreased fall risk.    Baseline 04/01/20: 16/24 scored today, improved just not to goal level    Time 8    Status On-going      PT LONG TERM GOAL #4   Title Pt will negotiate curb step, modified independently, for improved outdoor negotiation.    Baseline 04/01/20: pt continues to need up to mod assist at times with curbs with cane support due to tremors in right Le    Time 8    Period Weeks    Status On-going       PT LONG TERM GOAL #5   Title Pt will ambulate at least 1000 ft, indoor and outdoor surfaces, modifiied independently, with cane (vs no device), for improved outdoor and community gait.    Baseline 04/01/20: unable to assess full distance due to time contraints    Time 8    Period Weeks    Status On-going                 Plan - 04/15/20 1452    Clinical Impression Statement 10th Visit Progress Note, covering dates 02/15/2020-04/15/2020 (Pt did have an extended time out of therapy awaiting follow-up from MD due to a fall and pain.)  Subjective measures:  Pt does not understand why his R foot is uncontrolled at times and has eipsodes of sticking to the floor.  Objective: Recent DGI 16/24, TUG 18.35, curb negotiation with cane with min guard assist (LLE leading ascending and descending due to unpredictability of RLE foot placement descneding).  At times, pt has difficulty initiating gait with RLE, and he demonstrates tremors/ataxic movements in RLE with attempts at foot placement.  Have advised patient to communicate with his physician about these ongoing issues.  Pt is progressing towards LTGs and will continue to benefit from skilled PT to further address balance and gait for improved overall functional mobility.    Personal Factors and Comorbidities Comorbidity 3+    Comorbidities L4-5 microdisckectomy 01/05/2020; radiculopathy lumbar region, R foot drop, LBP with sciatica, lumbar herniated disk  Examination-Activity Limitations Locomotion Level;Transfers;Stairs;Stand    Examination-Participation Restrictions Community Activity;Yard Work    [employer] Evolving/Moderate complexity    Rehab Potential Good    PT Frequency 2x / week    PT Duration 8 weeks   including eval week   PT Treatment/Interventions ADLs/Self Care Home Management;Gait training;Functional mobility training;DME Instruction;Neuromuscular re-education;Balance training;Therapeutic exercise;Therapeutic  activities;Patient/family education;Passive range of motion;Manual techniques;Stair training;Orthotic Fit/Training    PT Next Visit Plan Compliant surfaces, step strategy, work head motions, balance and dynamic gait activities.  Consider adding step strategies and/or corner balance exercises to HEP    PT Home Exercise Plan MedBridge [email or web address]    Consulted and Agree with Plan of Care Patient           Patient will benefit from skilled therapeutic intervention in order to improve the following deficits and impairments:  Abnormal gait, Difficulty walking, Decreased range of motion, Decreased activity tolerance, Decreased balance, Impaired flexibility, Decreased mobility, Decreased strength, Decreased coordination  Visit Diagnosis: Other abnormalities of gait and mobility  Unsteadiness on feet     Problem List Patient Active Problem List   Diagnosis Date Noted  . S/P lumbar discectomy 01/26/2020  . Former smoker 03/23/2019  . Prostate cancer ([place]) 05/10/2015  . History of colonic polyps 05/10/2015  . Premature atrial contractions 05/10/2015  . Hyperlipidemia 01/25/2011  . CAD (coronary artery disease), native coronary artery 01/25/2011    Zamia Tyminski W. 04/15/2020, 2:59 PM  Frazier Butt., PT   [facility] [address] [place], [place], [address] Phone: [phone]   Fax:  [phone]  Name: Braedin Millhouse MRN: [id number] Date of Birth: Jan 29, 1946

## 2020-04-17 ENCOUNTER — Other Ambulatory Visit: Payer: Self-pay | Admitting: Family Medicine

## 2020-04-17 DIAGNOSIS — E785 Hyperlipidemia, unspecified: Secondary | ICD-10-CM

## 2020-04-19 ENCOUNTER — Ambulatory Visit: Payer: PPO | Admitting: Physical Therapy

## 2020-04-19 ENCOUNTER — Other Ambulatory Visit: Payer: Self-pay

## 2020-04-19 DIAGNOSIS — R2681 Unsteadiness on feet: Secondary | ICD-10-CM

## 2020-04-19 DIAGNOSIS — R2689 Other abnormalities of gait and mobility: Secondary | ICD-10-CM

## 2020-04-19 NOTE — Therapy (Signed)
[facility] [address] [place], [place], [address] Phone: [phone]   Fax:  [phone]  Physical Therapy Treatment  Patient Details  Name: Doyle Kunath MRN: [id number] Date of Birth: [birth year] Referring Provider (PT): Erline Levine, MD   Encounter Date: 04/19/2020   PT End of Session - 04/19/20 0812    Visit Number 11    Number of Visits 16    Date for PT Re-Evaluation 51/76/16   90 day cert for 8-wk POC (due to pt being out of town several times)   Authorization Type [employer]    PT Start Time [phone]   PT not aware pt waiting in lobby   PT Stop Time 0847    PT Time Calculation (min) 39 min    Equipment Utilized During Treatment Gait belt    Activity Tolerance Patient tolerated treatment well    Behavior During Therapy [facility] for tasks assessed/performed           Past Medical History:  Diagnosis Date  . Coronary artery disease    NONOBSTRUCTIVE  . Gallbladder disease   . GERD (gastroesophageal reflux disease)   . Hyperlipidemia   . Migraine aura without headache   . Prostate cancer ([place])   . Seasonal allergies     Past Surgical History:  Procedure Laterality Date  . CARDIAC CATHETERIZATION  07/16/2006   EF 50-55%  . CARDIOVASCULAR STRESS TEST  07/08/2006   EF 57%  . CATARACT EXTRACTION, BILATERAL    . COLONOSCOPY  09/14/2008  . CYSTOSCOPY N/A 03/25/2019   Procedure: CYSTOSCOPY;  Surgeon: Ardis Hughs, MD;  Location: [facility];  Service: Urology;  Laterality: N/A;  No seeds detected in bladder per Dr. Louis Meckel  . LAPAROSCOPIC CHOLECYSTECTOMY  2008  . PROSTATE BIOPSY  2009  . PROSTATE BIOPSY  06/27/2012  . PROSTATE BIOPSY  06/08/2013  . PROSTATE BIOPSY  03/19/2016  . PROSTATE BIOPSY  04/17/2018  . RADIOACTIVE SEED IMPLANT N/A 03/25/2019   Procedure: RADIOACTIVE SEED IMPLANT/BRACHYTHERAPY IMPLANT;  Surgeon: Ardis Hughs, MD;  Location: [facility];   Service: Urology;  Laterality: N/A;  . SPACE OAR INSTILLATION N/A 03/25/2019   Procedure: SPACE OAR INSTILLATION;  Surgeon: Ardis Hughs, MD;  Location: [facility];  Service: Urology;  Laterality: N/A;    There were no vitals filed for this visit.   Subjective Assessment - 04/19/20 0812    Subjective Would like to make sure to get a copy of all of my exercises before I finish with therapy.    Pertinent History L4-5 microdisckectomy 01/05/2020; radiculopathy lumbar region, R foot drop, LBP with sciatica, lumbar herniated disk    Patient Stated Goals To get my balance back is my primary goal.    Currently in Pain? No/denies                             [facility] Adult PT Treatment/Exercise - 04/19/20 0001      Transfers   Transfers Sit to Stand;Stand to Sit    Sit to Stand 5: Supervision;With upper extremity assist;From chair/3-in-1;From bed    Stand to Sit 5: Supervision;With upper extremity assist;To chair/3-in-1;To bed    Comments --      Ambulation/Gait   Ambulation/Gait Yes    Ambulation/Gait Assistance 5: Supervision;4: Min guard    Assistive device Straight cane;Other (Comment)   foot-up brace   Gait Pattern Step-through pattern;Decreased step  length - left;Decreased stance time - right;Decreased step length - right;Decreased dorsiflexion - right;Decreased weight shift to right;Ataxic;Poor foot clearance - right;Wide base of support    Ambulation Surface Level;Indoor    Ramp 5: Supervision    Ramp Details (indicate cue type and reason) Performed x 2, with cues for attention to R foot placement    Curb Other (comment)    Curb Details (indicate cue type and reason) Practiced x 6 reps at curb step in gym, using cane with LLE leading ascending and descending, with min guard>close supervision.          Gait negotiating stepping over 2" tall obstacles, x 3 consecutive reps; performed x 4 sets, with cane and supervisionn.  Cues for deliberate R  hip/knee flexion to clear obstacle as RLE is second foot to clear.  Gait negotiating stepping around obstacles, with cane, and supervision.     Balance Exercises - 04/19/20 0001      Balance Exercises: Standing   Standing Eyes Opened Wide (BOA);Foam/compliant surface;5 reps;Head turns;Narrow base of support (BOS)   Head nods, UE support at chair as needed   Standing Eyes Closed Wide (BOA);Narrow base of support (BOS);Head turns;Foam/compliant surface;5 reps   Head nods, UE support at chair as needed   Standing Eyes Closed Limitations On 2 pillows in corner    Stepping Strategy Anterior;Posterior;Lateral;UE support;10 reps;Limitations    Stepping Strategy Limitations Cues for step length/height for improved foot clearance.  Pt with ataxic foot placement on RLE at times and difficulty clearaing R foot to bring back to midline.    Step Ups Forward;6 inch;UE support 2;Limitations    Step Ups Limitations Step up/up, down/down x 10; single leg step up x 10.  Pt takes extra time for R foot placement.    Gait with Head Turns Forward;2 reps;Upper extremity support   Using cane along hallway, 2 reps each head turns/nods   Retro Gait 3 reps;Upper extremity support   Fwd/back walking at counter   Marching Solid surface;Upper extremity assist 1;Dynamic;Forwards;Retro   3 reps along counter, cues for RLE step height              PT Short Term Goals - 04/01/20 0941      PT SHORT TERM GOAL #1   Title Pt will be independent with HEP for improved strength, balance, transfers, and gait.  TARGET 03/11/2020    Baseline 04/01/20: met with current program, will benefit from updates as pt progresses    Time --    Period --    Status Achieved      PT SHORT TERM GOAL #2   Title Pt will improve TUG score to less than or equal to 13.5 seconds for decreased fall risk.    Baseline 04/01/20: 18.35 no device    Time --    Period --    Status Not Met      PT SHORT TERM GOAL #3   Title Pt will improve DGI score  to at least 14/24 for decreased fall risk.    Baseline 04/01/20: 16/24 scored today    Time --    Period --    Status Achieved      PT SHORT TERM GOAL #4   Title Pt will negotiate 4 steps, step through pattern, modified independently, for improved stair negotiation in the home.    Baseline 04/01/20: met in session with single rail support today    Time --    Period --    Status  Achieved      PT SHORT TERM GOAL #5   Title Pt will improve SLS to at least 2 seconds on RLE for improved stair, obstacle negotiation.    Baseline 04/01/20: unable to assess due to time contraints    Time --    Period --    Status Unable to assess             PT Long Term Goals - 04/05/20 1046      PT LONG TERM GOAL #1   Title Pt will be independent with progression of HEP for improved balance, strength, and gait.  TARGET 04/29/2020    Baseline 04/01/20: met with current program, will benefit from updating as pt progresses    Time 8    Period Weeks    Status On-going      PT LONG TERM GOAL #2   Title Pt will improve TUG score to less than or equal to 13.5 sec for decreased fall risk.    Time 8    Period Weeks    Status Revised      PT LONG TERM GOAL #3   Title Pt will improve DGI score to at least 19/24 for decreased fall risk.    Baseline 04/01/20: 16/24 scored today, improved just not to goal level    Time 8    Status On-going      PT LONG TERM GOAL #4   Title Pt will negotiate curb step, modified independently, for improved outdoor negotiation.    Baseline 04/01/20: pt continues to need up to mod assist at times with curbs with cane support due to tremors in right Le    Time 8    Period Weeks    Status On-going      PT LONG TERM GOAL #5   Title Pt will ambulate at least 1000 ft, indoor and outdoor surfaces, modifiied independently, with cane (vs no device), for improved outdoor and community gait.    Baseline 04/01/20: unable to assess full distance due to time contraints    Time 8     Period Weeks    Status On-going                 Plan - 04/19/20 1747    Clinical Impression Statement Continued to work on balance exercises for stepping strategies and for balance reactions, but pt continues to have ataxic/tremored foot placement with RLE with step strategy exercises. Extra time needed for foot placement for RLE with curb, step-up, and obstacle negotiation tasks.  He will continue to benefit from skilled PT to address balance and gait, working towards goals, anticipating d/c next week.    Personal Factors and Comorbidities Comorbidity 3+    Comorbidities L4-5 microdisckectomy 01/05/2020; radiculopathy lumbar region, R foot drop, LBP with sciatica, lumbar herniated disk    Examination-Activity Limitations Locomotion Level;Transfers;Stairs;Stand    Examination-Participation Restrictions Community Activity;Yard Work    [employer] Evolving/Moderate complexity    Rehab Potential Good    PT Frequency 2x / week    PT Duration 8 weeks   including eval week   PT Treatment/Interventions ADLs/Self Care Home Management;Gait training;Functional mobility training;DME Instruction;Neuromuscular re-education;Balance training;Therapeutic exercise;Therapeutic activities;Patient/family education;Passive range of motion;Manual techniques;Stair training;Orthotic Fit/Training    PT Next Visit Plan Consider adding step ups/step strategies and/or corner balance exercises to HEP.  Compliant surfaces, step strategy, work head motions, balance and dynamic gait activities.    PT [place] and  Agree with Plan of Care Patient           Patient will benefit from skilled therapeutic intervention in order to improve the following deficits and impairments:  Abnormal gait, Difficulty walking, Decreased range of motion, Decreased activity tolerance, Decreased balance, Impaired flexibility, Decreased mobility, Decreased strength, Decreased  coordination  Visit Diagnosis: Other abnormalities of gait and mobility  Unsteadiness on feet     Problem List Patient Active Problem List   Diagnosis Date Noted  . S/P lumbar discectomy 01/26/2020  . Former smoker 03/23/2019  . Prostate cancer ([place]) 05/10/2015  . History of colonic polyps 05/10/2015  . Premature atrial contractions 05/10/2015  . Hyperlipidemia 01/25/2011  . CAD (coronary artery disease), native coronary artery 01/25/2011    Aubryanna Nesheim W. 04/19/2020, 5:52 PM  Frazier Butt., PT   [facility] [address] [place], [place], [address] Phone: [phone]   Fax:  [phone]  Name: Shah Insley MRN: [id number] Date of Birth: Apr 27, 1946

## 2020-04-22 ENCOUNTER — Encounter: Payer: Self-pay | Admitting: Physical Therapy

## 2020-04-22 ENCOUNTER — Other Ambulatory Visit: Payer: Self-pay

## 2020-04-22 ENCOUNTER — Ambulatory Visit: Payer: PPO | Admitting: Physical Therapy

## 2020-04-22 DIAGNOSIS — R2689 Other abnormalities of gait and mobility: Secondary | ICD-10-CM

## 2020-04-22 DIAGNOSIS — R2681 Unsteadiness on feet: Secondary | ICD-10-CM

## 2020-04-22 DIAGNOSIS — M6281 Muscle weakness (generalized): Secondary | ICD-10-CM

## 2020-04-22 NOTE — Therapy (Signed)
[facility] [address] [place], [place], [address] Phone: [phone]   Fax:  [phone]  Physical Therapy Treatment  Patient Details  Name: Bernard Reed MRN: [id number] Date of Birth: 08/08/1945 Referring Provider (PT): Erline Levine, MD   Encounter Date: 04/22/2020   PT End of Session - 04/22/20 1022    Visit Number 12    Number of Visits 16    Date for PT Re-Evaluation 02/40/97   90 day cert for 8-wk POC (due to pt being out of town several times)   Authorization Type [employer]    PT Start Time 1017    PT Stop Time 1100    PT Time Calculation (min) 43 min    Equipment Utilized During Treatment Gait belt    Activity Tolerance Patient tolerated treatment well    Behavior During Therapy [facility] for tasks assessed/performed           Past Medical History:  Diagnosis Date  . Coronary artery disease    NONOBSTRUCTIVE  . Gallbladder disease   . GERD (gastroesophageal reflux disease)   . Hyperlipidemia   . Migraine aura without headache   . Prostate cancer ([place])   . Seasonal allergies     Past Surgical History:  Procedure Laterality Date  . CARDIAC CATHETERIZATION  07/16/2006   EF 50-55%  . CARDIOVASCULAR STRESS TEST  07/08/2006   EF 57%  . CATARACT EXTRACTION, BILATERAL    . COLONOSCOPY  09/14/2008  . CYSTOSCOPY N/A 03/25/2019   Procedure: CYSTOSCOPY;  Surgeon: Ardis Hughs, MD;  Location: [facility];  Service: Urology;  Laterality: N/A;  No seeds detected in bladder per Dr. Louis Meckel  . LAPAROSCOPIC CHOLECYSTECTOMY  2008  . PROSTATE BIOPSY  2009  . PROSTATE BIOPSY  06/27/2012  . PROSTATE BIOPSY  06/08/2013  . PROSTATE BIOPSY  03/19/2016  . PROSTATE BIOPSY  04/17/2018  . RADIOACTIVE SEED IMPLANT N/A 03/25/2019   Procedure: RADIOACTIVE SEED IMPLANT/BRACHYTHERAPY IMPLANT;  Surgeon: Ardis Hughs, MD;  Location: [facility];  Service: Urology;  Laterality:  N/A;  . SPACE OAR INSTILLATION N/A 03/25/2019   Procedure: SPACE OAR INSTILLATION;  Surgeon: Ardis Hughs, MD;  Location: [facility];  Service: Urology;  Laterality: N/A;    There were no vitals filed for this visit.   Subjective Assessment - 04/22/20 1020    Subjective Pt wanting to add ex's that are being done in therapy to his HEP to continue doing them at home for when he's done in PT. No falls or pain to report.    Pertinent History L4-5 microdisckectomy 01/05/2020; radiculopathy lumbar region, R foot drop, LBP with sciatica, lumbar herniated disk    Patient Stated Goals To get my balance back is my primary goal.    Currently in Pain? No/denies    Pain Score 0-No pain                [facility] Adult PT Treatment/Exercise - 04/22/20 1022      Transfers   Transfers Sit to Stand;Stand to Sit    Sit to Stand 5: Supervision;With upper extremity assist;From chair/3-in-1;From bed    Stand to Sit 5: Supervision;With upper extremity assist;To chair/3-in-1;To bed      Ambulation/Gait   Ambulation/Gait Yes    Ambulation/Gait Assistance 5: Supervision    Ambulation/Gait Assistance Details gait around track with cane working on speed changes, scanning around in random directions and sudden stops/starts. min guard assist for  safety with no balance issues noted.     Ambulation Distance (Feet) 230 Feet   x1, plus around gym    Assistive device Straight cane;Other (Comment)   foot up brace   Gait Pattern Step-through pattern;Decreased step length - left;Decreased stance time - right;Decreased step length - right;Decreased dorsiflexion - right;Decreased weight shift to right;Ataxic;Poor foot clearance - right;Wide base of support    Ambulation Surface Level;Indoor    Curb Other (comment)   min guard assist   Curb Details (indicate cue type and reason) both aerobic step curbs next to counter: ascend/desceding both with cane for 6 laps with min guard assist. pt leading with LLE  both to ascend and descend.       Neuro Re-ed    Neuro Re-ed Details  for balance/muscle re-ed: 4 square stepping over therabands with cane for 5 laps each way with up to min assist cue to right LE tremors/ataxia               Balance Exercises - 04/22/20 1047      Balance Exercises: Standing   Standing Eyes Closed Narrow base of support (BOS);Foam/compliant surface;3 reps;30 secs;Limitations    Standing Eyes Closed Limitations standing across red foam beam with feet hip width apart, no UE support- EC 30 sec's x 3 reps with up to min assist for balance due to posterior lean. cues for posture/weight shifting to assist with balance correction.     Balance Beam standing across red foam beam: with light bil UE support on bars- alternating fwd stepping to floor/back onto beam, then alternating bwd stepping to floor/back onto beam for ~10 reps each with cues for increased step length/height to clear beam surface. increased time needed with right LE movements.                PT Short Term Goals - 04/01/20 0941      PT SHORT TERM GOAL #1   Title Pt will be independent with HEP for improved strength, balance, transfers, and gait.  TARGET 03/11/2020    Baseline 04/01/20: met with current program, will benefit from updates as pt progresses    Time --    Period --    Status Achieved      PT SHORT TERM GOAL #2   Title Pt will improve TUG score to less than or equal to 13.5 seconds for decreased fall risk.    Baseline 04/01/20: 18.35 no device    Time --    Period --    Status Not Met      PT SHORT TERM GOAL #3   Title Pt will improve DGI score to at least 14/24 for decreased fall risk.    Baseline 04/01/20: 16/24 scored today    Time --    Period --    Status Achieved      PT SHORT TERM GOAL #4   Title Pt will negotiate 4 steps, step through pattern, modified independently, for improved stair negotiation in the home.    Baseline 04/01/20: met in session with single rail support today     Time --    Period --    Status Achieved      PT SHORT TERM GOAL #5   Title Pt will improve SLS to at least 2 seconds on RLE for improved stair, obstacle negotiation.    Baseline 04/01/20: unable to assess due to time contraints    Time --    Period --    Status Unable  to assess             PT Long Term Goals - 04/05/20 1046      PT LONG TERM GOAL #1   Title Pt will be independent with progression of HEP for improved balance, strength, and gait.  TARGET 04/29/2020    Baseline 04/01/20: met with current program, will benefit from updating as pt progresses    Time 8    Period Weeks    Status On-going      PT LONG TERM GOAL #2   Title Pt will improve TUG score to less than or equal to 13.5 sec for decreased fall risk.    Time 8    Period Weeks    Status Revised      PT LONG TERM GOAL #3   Title Pt will improve DGI score to at least 19/24 for decreased fall risk.    Baseline 04/01/20: 16/24 scored today, improved just not to goal level    Time 8    Status On-going      PT LONG TERM GOAL #4   Title Pt will negotiate curb step, modified independently, for improved outdoor negotiation.    Baseline 04/01/20: pt continues to need up to mod assist at times with curbs with cane support due to tremors in right Le    Time 8    Period Weeks    Status On-going      PT LONG TERM GOAL #5   Title Pt will ambulate at least 1000 ft, indoor and outdoor surfaces, modifiied independently, with cane (vs no device), for improved outdoor and community gait.    Baseline 04/01/20: unable to assess full distance due to time contraints    Time 8    Period Weeks    Status On-going                 Plan - 04/22/20 1022    Clinical Impression Statement Today's skilled session continued to focus on curb negotation with cane, stepping strategies for balance and balance training on complaint surfaces with up to min assist needed at time. No issues noted or reported in session. The pt is  progressing toward goals.    Personal Factors and Comorbidities Comorbidity 3+    Comorbidities L4-5 microdisckectomy 01/05/2020; radiculopathy lumbar region, R foot drop, LBP with sciatica, lumbar herniated disk    Examination-Activity Limitations Locomotion Level;Transfers;Stairs;Stand    Examination-Participation Restrictions Community Activity;Yard Work    [employer] officer Evolving/Moderate complexity    Rehab Potential Good    PT Frequency 2x / week    PT Duration 8 weeks   including eval week   PT Treatment/Interventions ADLs/Self Care Home Management;Gait training;Functional mobility training;DME Instruction;Neuromuscular re-education;Balance training;Therapeutic exercise;Therapeutic activities;Patient/family education;Passive range of motion;Manual techniques;Stair training;Orthotic Fit/Training    PT Next Visit Plan Review advanced HEP and begin to address LTGs for anticipated discharge next week    PT [place] and Agree with Plan of Care Patient           Patient will benefit from skilled therapeutic intervention in order to improve the following deficits and impairments:  Abnormal gait, Difficulty walking, Decreased range of motion, Decreased activity tolerance, Decreased balance, Impaired flexibility, Decreased mobility, Decreased strength, Decreased coordination  Visit Diagnosis: Other abnormalities of gait and mobility  Unsteadiness on feet  Muscle weakness (generalized)     Problem List Patient Active Problem List   Diagnosis Date Noted  . S/P  lumbar discectomy 01/26/2020  . Former smoker 03/23/2019  . Prostate cancer ([place]) 05/10/2015  . History of colonic polyps 05/10/2015  . Premature atrial contractions 05/10/2015  . Hyperlipidemia 01/25/2011  . CAD (coronary artery disease), native coronary artery 01/25/2011    Willow Ora, PTA, [facility] [address] [place], [place] [address] [phone] 04/22/20, 11:59 AM   Name: Bernard Reed MRN: [id number] Date of Birth: August 15, 1945

## 2020-04-25 ENCOUNTER — Ambulatory Visit: Payer: PPO | Admitting: Physical Therapy

## 2020-04-25 ENCOUNTER — Other Ambulatory Visit: Payer: Self-pay

## 2020-04-25 DIAGNOSIS — R2689 Other abnormalities of gait and mobility: Secondary | ICD-10-CM | POA: Diagnosis not present

## 2020-04-25 DIAGNOSIS — R2681 Unsteadiness on feet: Secondary | ICD-10-CM

## 2020-04-25 NOTE — Patient Instructions (Signed)
Access Code: [email or web address] URL: [email or web address] Date: 04/25/2020 Prepared by: Mady Haagensen  Exercises Standing Single Leg Stance with Counter Support - 1 x daily - 5 x weekly - 1 sets - 3 reps - 10 second hold Standing March - 1 x daily - 5 x weekly - 1 sets - 3 reps Standing Tandem Balance with Counter Support - 1 x daily - 5 x weekly - 3 sets - 1 reps - 10 second hold Tandem Walking with Counter Support - 1 x daily - 5 x weekly - 1 sets - 3 reps Sit to stand in stride stance - 1-2 x daily - 7 x weekly - 2-3 sets - 10 reps Alternating Step Taps with Counter Support - 1 x daily - 7 x weekly - 3 sets - 10 reps Heel Toe Raises with Counter Support - 1 x daily - 5 x weekly - 1 sets - 10 reps  Added 04/25/2020: Standing Balance in Corner - 1 x daily - 5 x weekly - 1 sets - 10 reps Romberg Stance on Foam Pad - 1 x daily - 5 x weekly - 1 sets - 10 reps Side Stepping with Counter Support - 1 x daily - 5 x weekly - 1-2 sets - 10 reps Alternating Step Backward with Support - 1 x daily - 5 x weekly - 1-2 sets - 10 reps Alternating Step Forward with Support - 1 x daily - 5 x weekly - 1-2 sets - 10 reps

## 2020-04-25 NOTE — Therapy (Signed)
[facility] [address] [place], [place], [address] Phone: [phone]   Fax:  [phone]  Physical Therapy Treatment  Patient Details  Name: Bernard Reed MRN: [id number] Date of Birth: Jan 05, 1946 Referring Provider (PT): Erline Levine, MD   Encounter Date: 04/25/2020   PT End of Session - 04/25/20 1936    Visit Number 13    Number of Visits 16    Date for PT Re-Evaluation 33/29/51   90 day cert for 8-wk POC (due to pt being out of town several times)   Authorization Type [employer]    PT Start Time 1104    PT Stop Time 1145    PT Time Calculation (min) 41 min    Equipment Utilized During Treatment --    Activity Tolerance Patient tolerated treatment well    Behavior During Therapy [facility] for tasks assessed/performed           Past Medical History:  Diagnosis Date   Coronary artery disease    NONOBSTRUCTIVE   Gallbladder disease    GERD (gastroesophageal reflux disease)    Hyperlipidemia    Migraine aura without headache    Prostate cancer ([place])    Seasonal allergies     Past Surgical History:  Procedure Laterality Date   CARDIAC CATHETERIZATION  07/16/2006   EF 50-55%   CARDIOVASCULAR STRESS TEST  07/08/2006   EF 57%   CATARACT EXTRACTION, BILATERAL     COLONOSCOPY  09/14/2008   CYSTOSCOPY N/A 03/25/2019   Procedure: CYSTOSCOPY;  Surgeon: Ardis Hughs, MD;  Location: [facility];  Service: Urology;  Laterality: N/A;  No seeds detected in bladder per Dr. Louis Meckel   LAPAROSCOPIC CHOLECYSTECTOMY  2008   PROSTATE BIOPSY  2009   PROSTATE BIOPSY  06/27/2012   PROSTATE BIOPSY  06/08/2013   PROSTATE BIOPSY  03/19/2016   PROSTATE BIOPSY  04/17/2018   RADIOACTIVE SEED IMPLANT N/A 03/25/2019   Procedure: RADIOACTIVE SEED IMPLANT/BRACHYTHERAPY IMPLANT;  Surgeon: Ardis Hughs, MD;  Location: [facility];  Service: Urology;  Laterality: N/A;    SPACE OAR INSTILLATION N/A 03/25/2019   Procedure: SPACE OAR INSTILLATION;  Surgeon: Ardis Hughs, MD;  Location: [facility];  Service: Urology;  Laterality: N/A;    There were no vitals filed for this visit.   Subjective Assessment - 04/25/20 1110    Subjective Feel like my foot was sticking to the floor when I was checkin in at the lobby.    Pertinent History L4-5 microdisckectomy 01/05/2020; radiculopathy lumbar region, R foot drop, LBP with sciatica, lumbar herniated disk    Patient Stated Goals To get my balance back is my primary goal.    Currently in Pain? No/denies                        Gait Training: Discussed tips to reduce freezing with gait initiation of gait and turns.  Discussed posture, weightshifting, attention to R foot, and need to STOP and reset if he notices freezing episode. (Discussed having pt ask about this to Dr. Vertell Limber or possibly with f/u with neurologist).     [facility] Adult PT Treatment/Exercise - 04/25/20 0001      Ambulation/Gait   Ambulation/Gait Yes    Curb 5: Supervision;Other (comment)   Min guard   Curb Details (indicate cue type and reason) Curb negotiation in clinic, using cane, L foot leading ascending and descending, x 3 reps.  Cues  for improved R hip/knee flexion to clear curb step.      Standardized Balance Assessment   Standardized Balance Assessment Timed Up and Go Test      Timed Up and Go Test   TUG Normal TUG    Normal TUG (seconds) 14.94            Neuro Re-education: Standing Balance in Corner - 1 x daily - 5 x weekly - 1 sets - 10 reps  -On pillow, feet apart with EO head turns, nods x 10 reps each  -On pillow, feet apart with EC head turns, nods x 10 reps each Romberg Stance on Foam Pad - 1 x daily - 5 x weekly - 1 sets - 10 reps  -On pillow, feet together with EO head turns, nods x 10 reps each  -On pillow, feet together with EC head turns, nods x 10 reps each Side Stepping with Counter  Support - 1 x daily - 5 x weekly - 1-2 sets - 10 reps (side step and weightshift, alternating legs) Alternating Step Backward with Support - 1 x daily - 5 x weekly - 1-2 sets - 10 reps Alternating Step Forward with Support - 1 x daily - 5 x weekly - 1-2 sets - 10 reps     Also performed partial tandem stance with EO head turns x 5, head nods x 5, UE support at chair.    PT Education - 04/25/20 1935    Education Details Additions to HEP-see insert    Person(s) Educated Patient    Methods Explanation;Demonstration;Handout    Comprehension Verbalized understanding;Returned demonstration            PT Short Term Goals - 04/01/20 0941      PT SHORT TERM GOAL #1   Title Pt will be independent with HEP for improved strength, balance, transfers, and gait.  TARGET 03/11/2020    Baseline 04/01/20: met with current program, will benefit from updates as pt progresses    Time --    Period --    Status Achieved      PT SHORT TERM GOAL #2   Title Pt will improve TUG score to less than or equal to 13.5 seconds for decreased fall risk.    Baseline 04/01/20: 18.35 no device    Time --    Period --    Status Not Met      PT SHORT TERM GOAL #3   Title Pt will improve DGI score to at least 14/24 for decreased fall risk.    Baseline 04/01/20: 16/24 scored today    Time --    Period --    Status Achieved      PT SHORT TERM GOAL #4   Title Pt will negotiate 4 steps, step through pattern, modified independently, for improved stair negotiation in the home.    Baseline 04/01/20: met in session with single rail support today    Time --    Period --    Status Achieved      PT SHORT TERM GOAL #5   Title Pt will improve SLS to at least 2 seconds on RLE for improved stair, obstacle negotiation.    Baseline 04/01/20: unable to assess due to time contraints    Time --    Period --    Status Unable to assess             PT Long Term Goals - 04/25/20 1937      PT LONG  TERM GOAL #1   Title Pt  will be independent with progression of HEP for improved balance, strength, and gait.  TARGET 04/29/2020    Baseline 04/01/20: met with current program, will benefit from updating as pt progresses    Time 8    Period Weeks    Status On-going      PT LONG TERM GOAL #2   Title Pt will improve TUG score to less than or equal to 13.5 sec for decreased fall risk.    Baseline 14.94 04/25/2020    Time 8    Period Weeks    Status Not Met      PT LONG TERM GOAL #3   Title Pt will improve DGI score to at least 19/24 for decreased fall risk.    Baseline 04/01/20: 16/24 scored today, improved just not to goal level    Time 8    Status On-going      PT LONG TERM GOAL #4   Title Pt will negotiate curb step, modified independently, for improved outdoor negotiation.    Baseline 04/25/2020 supervision/min guard    Time 8    Period Weeks    Status Partially Met      PT LONG TERM GOAL #5   Title Pt will ambulate at least 1000 ft, indoor and outdoor surfaces, modifiied independently, with cane (vs no device), for improved outdoor and community gait.    Baseline 04/01/20: unable to assess full distance due to time contraints    Time 8    Period Weeks    Status On-going                 Plan - 04/25/20 1939    Clinical Impression Statement Focused large portion of session on adding to HEP, with updated pictures added to HEP.  Also began assessing LTGs this visit, with LTG 2 not met, with TUG score 14.94 sec.  LTG 4 partially met, with pt improving curb negotiation, with supervision using cane, L foot leading up and down curb.  Pt appears motivated to continue HEP at home; will be appropriate for d/c next visit.    Personal Factors and Comorbidities Comorbidity 3+    Comorbidities L4-5 microdisckectomy 01/05/2020; radiculopathy lumbar region, R foot drop, LBP with sciatica, lumbar herniated disk    Examination-Activity Limitations Locomotion Level;Transfers;Stairs;Stand    Examination-Participation  Restrictions Community Activity;Yard Work    [employer] officer Evolving/Moderate complexity    Rehab Potential Good    PT Frequency 2x / week    PT Duration 8 weeks   including eval week   PT Treatment/Interventions ADLs/Self Care Home Management;Gait training;Functional mobility training;DME Instruction;Neuromuscular re-education;Balance training;Therapeutic exercise;Therapeutic activities;Patient/family education;Passive range of motion;Manual techniques;Stair training;Orthotic Fit/Training    PT Next Visit Plan Review advanced HEP; check remaining LTGs for anticipated discharge next visit    [place] and Agree with Plan of Care Patient           Patient will benefit from skilled therapeutic intervention in order to improve the following deficits and impairments:  Abnormal gait, Difficulty walking, Decreased range of motion, Decreased activity tolerance, Decreased balance, Impaired flexibility, Decreased mobility, Decreased strength, Decreased coordination  Visit Diagnosis: Other abnormalities of gait and mobility  Unsteadiness on feet     Problem List Patient Active Problem List   Diagnosis Date Noted   S/P lumbar discectomy 01/26/2020   Former smoker 03/23/2019   Prostate cancer ([place]) 05/10/2015   History  of colonic polyps 05/10/2015   Premature atrial contractions 05/10/2015   Hyperlipidemia 01/25/2011   CAD (coronary artery disease), native coronary artery 01/25/2011    Renzo Vincelette W. 04/25/2020, 7:44 PM Frazier Butt., PT  [facility] [address] [place], [place], [address] Phone: [phone]   Fax:  [phone]  Name: Bernard Reed MRN: [id number] Date of Birth: 1945/07/17

## 2020-04-27 ENCOUNTER — Other Ambulatory Visit: Payer: Self-pay

## 2020-04-27 ENCOUNTER — Ambulatory Visit: Payer: PPO | Admitting: Physical Therapy

## 2020-04-27 DIAGNOSIS — R2689 Other abnormalities of gait and mobility: Secondary | ICD-10-CM

## 2020-04-27 DIAGNOSIS — R2681 Unsteadiness on feet: Secondary | ICD-10-CM

## 2020-04-27 NOTE — Therapy (Addendum)
[facility] [address] [place], [place], [address] Phone: [phone]   Fax:  [phone]  Physical Therapy Treatment/Discharge Summary  Patient Details  Name: Bernard Reed MRN: [id number] Date of Birth: 1946/05/01 Referring Provider (PT): Erline Levine, MD   Encounter Date: 04/27/2020   PT End of Session - 04/27/20 2159    Visit Number 14    Number of Visits 16    Date for PT Re-Evaluation 18/56/31   90 day cert for 8-wk POC (due to pt being out of town several times)   Authorization Type [employer]    PT Start Time 1444    PT Stop Time 1526    PT Time Calculation (min) 42 min    Equipment Utilized During Treatment Gait belt    Activity Tolerance Patient tolerated treatment well    Behavior During Therapy [facility] for tasks assessed/performed           Past Medical History:  Diagnosis Date  . Coronary artery disease    NONOBSTRUCTIVE  . Gallbladder disease   . GERD (gastroesophageal reflux disease)   . Hyperlipidemia   . Migraine aura without headache   . Prostate cancer ([place])   . Seasonal allergies     Past Surgical History:  Procedure Laterality Date  . CARDIAC CATHETERIZATION  07/16/2006   EF 50-55%  . CARDIOVASCULAR STRESS TEST  07/08/2006   EF 57%  . CATARACT EXTRACTION, BILATERAL    . COLONOSCOPY  09/14/2008  . CYSTOSCOPY N/A 03/25/2019   Procedure: CYSTOSCOPY;  Surgeon: Ardis Hughs, MD;  Location: [facility];  Service: Urology;  Laterality: N/A;  No seeds detected in bladder per Dr. Louis Meckel  . LAPAROSCOPIC CHOLECYSTECTOMY  2008  . PROSTATE BIOPSY  2009  . PROSTATE BIOPSY  06/27/2012  . PROSTATE BIOPSY  06/08/2013  . PROSTATE BIOPSY  03/19/2016  . PROSTATE BIOPSY  04/17/2018  . RADIOACTIVE SEED IMPLANT N/A 03/25/2019   Procedure: RADIOACTIVE SEED IMPLANT/BRACHYTHERAPY IMPLANT;  Surgeon: Ardis Hughs, MD;  Location: [facility];  Service:  Urology;  Laterality: N/A;  . SPACE OAR INSTILLATION N/A 03/25/2019   Procedure: SPACE OAR INSTILLATION;  Surgeon: Ardis Hughs, MD;  Location: [facility];  Service: Urology;  Laterality: N/A;    There were no vitals filed for this visit.   Subjective Assessment - 04/27/20 1447    Subjective Have done the exercises without difficulty.    Pertinent History L4-5 microdisckectomy 01/05/2020; radiculopathy lumbar region, R foot drop, LBP with sciatica, lumbar herniated disk    Patient Stated Goals To get my balance back is my primary goal.    Currently in Pain? No/denies                             [facility] Adult PT Treatment/Exercise - 04/27/20 0001      Ambulation/Gait   Ambulation/Gait Yes    Ambulation/Gait Assistance 6: Modified independent (Device/Increase time);5: Supervision    Ambulation/Gait Assistance Details 1 LOB on decline of ramp initially upon starting gait outside.    Ambulation Distance (Feet) 1000 Feet    Assistive device Straight cane;Other (Comment)    Gait Pattern Step-through pattern;Decreased step length - left;Decreased stance time - right;Decreased step length - right;Decreased dorsiflexion - right;Decreased weight shift to right;Ataxic;Poor foot clearance - right;Wide base of support    Ambulation Surface Level;Indoor;Unlevel;Outdoor    Curb 5: Supervision;Other (comment)  Curb Details (indicate cue type and reason) Curb negotiation outdoors, x 4 reps, with supervision, cues for foot placement close to edge of curb when descending.  When pt not close enough to edge, pt's R foot tremors, with difficulty with foot placement off curb.      Standardized Balance Assessment   Standardized Balance Assessment Dynamic Gait Index      Dynamic Gait Index   Level Surface Mild Impairment    Change in Gait Speed Normal    Gait with Horizontal Head Turns Mild Impairment    Gait with Vertical Head Turns Normal    Gait and Pivot Turn  Normal    Step Over Obstacle Severe Impairment    Step Around Obstacles Mild Impairment    Steps Mild Impairment    Total Score 17      Self-Care   Self-Care Other Self-Care Comments    Other Self-Care Comments  Discussed progress towards goals and POC, plans for d/c this visit.       Neuro Re-ed    Neuro Re-ed Details  Reviewed balance exercises as part of HEP given last visit-pt return demo understanding.            Neuro Re-education: Standing Balance in Corner - 1 x daily - 5 x weekly - 1 sets - 10 reps            -On pillow, feet apart with EO head turns, nods x 10 reps each            -On pillow, feet apart with EC head turns, nods x 10 reps each Romberg Stance on Foam Pad - 1 x daily - 5 x weekly - 1 sets - 10 reps            -On pillow, feet together with EO head turns, nods x 10 reps each            -On pillow, feet together with EC head turns, nods x 10 reps each Side Stepping with Counter Support - 1 x daily - 5 x weekly - 1-2 sets - 10 reps (side step and weightshift, alternating legs) Alternating Step Backward with Support - 1 x daily - 5 x weekly - 1-2 sets - 10 reps Alternating Step Forward with Support - 1 x daily - 5 x weekly - 1-2 sets - 10 reps       PT Education - 04/27/20 2158    Education Details [place] towards goals, plans for d/c this visit.    Person(s) Educated Patient    Methods Explanation    Comprehension Verbalized understanding            PT Short Term Goals - 04/01/20 0941      PT SHORT TERM GOAL #1   Title Pt will be independent with HEP for improved strength, balance, transfers, and gait.  TARGET 03/11/2020    Baseline 04/01/20: met with current program, will benefit from updates as pt progresses    Time --    Period --    Status Achieved      PT SHORT TERM GOAL #2   Title Pt will improve TUG score to less than or equal to 13.5 seconds for decreased fall risk.    Baseline 04/01/20: 18.35 no device    Time --    Period --     Status Not Met      PT SHORT TERM GOAL #3   Title Pt will improve DGI score to at  least 14/24 for decreased fall risk.    Baseline 04/01/20: 16/24 scored today    Time --    Period --    Status Achieved      PT SHORT TERM GOAL #4   Title Pt will negotiate 4 steps, step through pattern, modified independently, for improved stair negotiation in the home.    Baseline 04/01/20: met in session with single rail support today    Time --    Period --    Status Achieved      PT SHORT TERM GOAL #5   Title Pt will improve SLS to at least 2 seconds on RLE for improved stair, obstacle negotiation.    Baseline 04/01/20: unable to assess due to time contraints    Time --    Period --    Status Unable to assess             PT Long Term Goals - 04/27/20 2159      PT LONG TERM GOAL #1   Title Pt will be independent with progression of HEP for improved balance, strength, and gait.  TARGET 04/29/2020    Baseline 04/01/20: met with current program, will benefit from updating as pt progresses    Time 8    Period Weeks    Status Achieved      PT LONG TERM GOAL #2   Title Pt will improve TUG score to less than or equal to 13.5 sec for decreased fall risk.    Baseline 14.94 04/25/2020    Time 8    Period Weeks    Status Not Met      PT LONG TERM GOAL #3   Title Pt will improve DGI score to at least 19/24 for decreased fall risk.    Baseline 04/01/20: 16/24 scored today, improved just not to goal level; 17/24 04/28/2023    Time 8    Status Not Met      PT LONG TERM GOAL #4   Title Pt will negotiate curb step, modified independently, for improved outdoor negotiation.    Baseline 04/25/2020 supervision/min guard    Time 8    Period Weeks    Status Partially Met      PT LONG TERM GOAL #5   Title Pt will ambulate at least 1000 ft, indoor and outdoor surfaces, modifiied independently, with cane (vs no device), for improved outdoor and community gait.    Baseline 04/27/2020 supervision    Time  8    Period Weeks    Status Partially Met                 Plan - 04/27/20 2201    Clinical Impression Statement Reviewed HEP additions this visit, with pt meeting LTG1.  LTG 3 not met, with DGI score 17/24, improved from 16/24.  LTG 5 partially met, with pt ambulating 1000 ft with cane, needs supervision, occaisonal LOB on decline, curb.  DIscussed and pt in agreement with d/c from PT, with pt making some improvements in functional mobility measures, but remains at fall risk.  Pt's R foot with intentional movements (curb negotiation, obstacle negotiation, step strategy for balance) exhibits a tremor/ataxic type movement, which has remained largely unchanged throughout course of therapy.  Have advised pt to follow up with MD about this.  Pt is appropriate for d/c at this time.    Personal Factors and Comorbidities Comorbidity 3+    Comorbidities L4-5 microdisckectomy 01/05/2020; radiculopathy lumbar region, R foot drop, LBP with  sciatica, lumbar herniated disk    Examination-Activity Limitations Locomotion Level;Transfers;Stairs;Stand    Examination-Participation Restrictions Community Activity;Yard Work    [employer] officer Evolving/Moderate complexity    Rehab Potential Good    PT Frequency 2x / week    PT Duration 8 weeks   including eval week   PT Treatment/Interventions ADLs/Self Care Home Management;Gait training;Functional mobility training;DME Instruction;Neuromuscular re-education;Balance training;Therapeutic exercise;Therapeutic activities;Patient/family education;Passive range of motion;Manual techniques;Stair training;Orthotic Fit/Training    PT Next Visit Plan D/C this visit    PT Home Exercise Plan [place] and Agree with Plan of Care Patient           Patient will benefit from skilled therapeutic intervention in order to improve the following deficits and impairments:  Abnormal gait, Difficulty walking, Decreased range of motion,  Decreased activity tolerance, Decreased balance, Impaired flexibility, Decreased mobility, Decreased strength, Decreased coordination  Visit Diagnosis: Other abnormalities of gait and mobility  Unsteadiness on feet     Problem List Patient Active Problem List   Diagnosis Date Noted  . S/P lumbar discectomy 01/26/2020  . Former smoker 03/23/2019  . Prostate cancer ([place]) 05/10/2015  . History of colonic polyps 05/10/2015  . Premature atrial contractions 05/10/2015  . Hyperlipidemia 01/25/2011  . CAD (coronary artery disease), native coronary artery 01/25/2011    Timia Casselman W. 04/27/2020, 10:06 PM  [facility] [address] [place], [place], [address] Phone: [phone]   Fax:  [phone]  Name: [place] Monds MRN: [id number] Date of Birth: 1945/09/13   PHYSICAL THERAPY DISCHARGE SUMMARY  Visits from Start of Care: 14  Current functional level related to goals / functional outcomes:  PT Long Term Goals - 04/27/20 2159      PT LONG TERM GOAL #1   Title Pt will be independent with progression of HEP for improved balance, strength, and gait.  TARGET 04/29/2020    Baseline 04/01/20: met with current program, will benefit from updating as pt progresses    Time 8    Period Weeks    Status Achieved      PT LONG TERM GOAL #2   Title Pt will improve TUG score to less than or equal to 13.5 sec for decreased fall risk.    Baseline 14.94 04/25/2020    Time 8    Period Weeks    Status Not Met      PT LONG TERM GOAL #3   Title Pt will improve DGI score to at least 19/24 for decreased fall risk.    Baseline 04/01/20: 16/24 scored today, improved just not to goal level; 17/24 04/28/2023    Time 8    Status Not Met      PT LONG TERM GOAL #4   Title Pt will negotiate curb step, modified independently, for improved outdoor negotiation.    Baseline 04/25/2020 supervision/min guard    Time 8    Period Weeks     Status Partially Met      PT LONG TERM GOAL #5   Title Pt will ambulate at least 1000 ft, indoor and outdoor surfaces, modifiied independently, with cane (vs no device), for improved outdoor and community gait.    Baseline 04/27/2020 supervision    Time 8    Period Weeks    Status Partially Met          Pt has met 1 of 5 LTGs, 2 of 5 LTGs partially met.   Remaining deficits: Fall risk per  DGI and TUG; decreased balance, RLE tremor/ataxic movements with    Education / Equipment: Educated in fall prevention, HEP  Plan: Patient agrees to discharge.  Patient goals were not met. Patient is being discharged due to being pleased with the current functional level.  ?????Pt appears to have maximized rehab potential with balance at this time.  Frazier Butt., PT

## 2020-05-19 DIAGNOSIS — Z8546 Personal history of malignant neoplasm of prostate: Secondary | ICD-10-CM | POA: Diagnosis not present

## 2020-05-24 ENCOUNTER — Other Ambulatory Visit: Payer: Self-pay

## 2020-05-24 ENCOUNTER — Ambulatory Visit
Admission: RE | Admit: 2020-05-24 | Discharge: 2020-05-24 | Disposition: A | Discharge status: Home / Self Care | Payer: PPO | Source: Ambulatory Visit | Attending: Family Medicine | Admitting: Family Medicine

## 2020-05-24 ENCOUNTER — Ambulatory Visit (INDEPENDENT_AMBULATORY_CARE_PROVIDER_SITE_OTHER): Payer: PPO | Admitting: Family Medicine

## 2020-05-24 VITALS — BP 150/90 | HR 99 | Temp 98.6°F | Wt 195.8 lb

## 2020-05-24 DIAGNOSIS — M542 Cervicalgia: Secondary | ICD-10-CM | POA: Diagnosis not present

## 2020-05-24 DIAGNOSIS — Z8546 Personal history of malignant neoplasm of prostate: Secondary | ICD-10-CM | POA: Diagnosis not present

## 2020-05-24 DIAGNOSIS — M2578 Osteophyte, vertebrae: Secondary | ICD-10-CM | POA: Diagnosis not present

## 2020-05-24 DIAGNOSIS — M47812 Spondylosis without myelopathy or radiculopathy, cervical region: Secondary | ICD-10-CM | POA: Diagnosis not present

## 2020-05-24 DIAGNOSIS — R351 Nocturia: Secondary | ICD-10-CM | POA: Diagnosis not present

## 2020-05-24 MED ORDER — HYDROCODONE-ACETAMINOPHEN 5-325 MG PO TABS: 1.0000 | ORAL_TABLET | Freq: Four times a day (QID) | ORAL | 0 refills | Status: DC | PRN

## 2020-05-24 NOTE — Progress Notes (Signed)
   Subjective:    Patient ID: Bernard Reed, male    DOB: 1946/03/01, 74 y.o.   MRN: [id number]  HPI He states that he fell 2 days ago but cannot remember exactly what happened other than he then noted right arm pain and since then the pain has become more in his neck.  He notes tingling down to the right thumb and increased neck pain with extension.  He does not have any numbness, tingling or weakness.  He did have back surgery in August and still in rehab for this. He is on diclofenac and using Robaxin.  Review of Systems     Objective:   Physical Exam Alert and complaining of pain.  Pain on extension of the neck with positive Spurling test.  Normal motor, sensory and DTRs.       Assessment & Plan:  Neck pain - Plan: DG Cervical Spine Complete, HYDROcodone-acetaminophen (NORCO/VICODIN) 5-325 MG tablet Recommend he continue on diclofenac and Robaxin and I will give him codeine.  Explained that this could easily be a herniated disc.  He will return here in 10 to 14 days for recheck or sooner if he has more difficulty.  He was comfortable with that.

## 2020-06-07 ENCOUNTER — Encounter: Payer: Self-pay | Admitting: Family Medicine

## 2020-06-07 ENCOUNTER — Ambulatory Visit (INDEPENDENT_AMBULATORY_CARE_PROVIDER_SITE_OTHER): Payer: PPO | Admitting: Family Medicine

## 2020-06-07 ENCOUNTER — Telehealth: Payer: Self-pay | Admitting: Family Medicine

## 2020-06-07 VITALS — BP 132/86 | HR 81 | Temp 97.9°F | Wt 196.4 lb

## 2020-06-07 DIAGNOSIS — M542 Cervicalgia: Secondary | ICD-10-CM | POA: Diagnosis not present

## 2020-06-07 DIAGNOSIS — M1990 Unspecified osteoarthritis, unspecified site: Secondary | ICD-10-CM | POA: Diagnosis not present

## 2020-06-07 DIAGNOSIS — Z86010 Personal history of colonic polyps: Secondary | ICD-10-CM

## 2020-06-07 NOTE — Progress Notes (Signed)
   Subjective:    Patient ID: Bernard Reed, male    DOB: 11-Nov-1945, 74 y.o.   MRN: [id number]  HPI He is here for a recheck.  He states that he is roughly 80% better.  He does have occasional difficulty especially with neck motion but no numbness or tingling.   Review of Systems     Objective:   Physical Exam Alert and in no distress.  Normal motor, sensory and DTRs. X-rays do show evidence of arthritis.      Assessment & Plan:  Neck pain  Arthritis  History of colonic polyps Recommend continued supportive care.  Also recommend heat and stretching. At the end of the encounter he then mentioned difficulty with excessive abdominal gas.  He has a previous history of colonic polyps with a repeat colonoscopy done in 2023.  Recommend monitoring his food intake to see if this plays a role and if continued difficulty might possibly need referral to GI.

## 2020-06-07 NOTE — Telephone Encounter (Signed)
Pt would like you to call him, he forgot to ask you a question when he was here

## 2020-06-07 NOTE — Patient Instructions (Signed)
Heat for 20 minutes 3 times per day with some gentle stretching after that to help you get back your full range of motion you can stay on that diclofenac more or less as needed.  If things get worse call me and we will do more work

## 2020-06-28 ENCOUNTER — Ambulatory Visit (INDEPENDENT_AMBULATORY_CARE_PROVIDER_SITE_OTHER): Payer: PPO | Admitting: Family Medicine

## 2020-06-28 ENCOUNTER — Encounter: Payer: Self-pay | Admitting: Family Medicine

## 2020-06-28 VITALS — BP 132/80 | HR 90 | Temp 98.0°F | Wt 186.2 lb

## 2020-06-28 DIAGNOSIS — R251 Tremor, unspecified: Secondary | ICD-10-CM | POA: Diagnosis not present

## 2020-06-28 DIAGNOSIS — R634 Abnormal weight loss: Secondary | ICD-10-CM

## 2020-06-28 DIAGNOSIS — R630 Anorexia: Secondary | ICD-10-CM | POA: Diagnosis not present

## 2020-06-28 DIAGNOSIS — Z86010 Personal history of colonic polyps: Secondary | ICD-10-CM

## 2020-06-28 DIAGNOSIS — R194 Change in bowel habit: Secondary | ICD-10-CM | POA: Diagnosis not present

## 2020-06-28 NOTE — Progress Notes (Signed)
   Subjective:    Patient ID: Bernard Reed, male    DOB: 26-Sep-1945, 74 y.o.   MRN: [id number]  HPI He is here for consult concerning multiple issues.  It was difficult for him to stay on task however he apparently is having continued difficulty with right leg weakness and needing to use a walker.  He is scheduled to see Dr. Venetia Maxon tomorrow for this.  He has had previous surgery by him.  He also has a history of hand tremor and apparently has been seen in the past by Dr. Arbutus Leas.  Apparently over the last several months this has gotten much worse. He also notes a several month history of decreased appetite, change in his bowel habits and weight loss.  He states that he is just not hungry but has had no nausea or vomiting or abdominal pain.  He does state that he is having a change in his bowel habits.  He initially described fecal incontinence and then describes no necessarily solid stools but intermittent solid stools that he describes as rabbit pellets.  He does have a history of colonic polyps.  He now notes that he has to sit down when he has to urinate because he will have some fecal leakage.  This has been more of an issue in the last month.  He is having no urinary symptoms. He also stated 01-Jan-2025he had coded related symptoms of sore throat rhinorrhea, cough but did not get tested.  He has had the booster.  Review of Systems     Objective:   Physical Exam Alert and in no distress.  Cardiac exam shows regular rhythm without murmurs gallops.  Lungs are clear to auscultation.  DTRs were 1+.  No clonus is noted.       Assessment & Plan:  History of colonic polyps - Plan: CBC with Differential/Platelet, Comprehensive metabolic panel, Ambulatory referral to Gastroenterology  Weight loss - Plan: CBC with Differential/Platelet, Comprehensive metabolic panel, TSH, Ambulatory referral to Gastroenterology  Change in bowel habits - Plan: CBC with Differential/Platelet, Comprehensive metabolic  panel, Ambulatory referral to Gastroenterology  Anorexia  Tremors of nervous system - Plan: CBC with Differential/Platelet, Comprehensive metabolic panel, TSH It seems that his main issue today is his weight loss and decreased appetite.  I will therefore refer to GI for further evaluation of this. He is to follow-up with Dr. Arbutus Leas concerning the hand tremor and with Dr. Venetia Maxon concerning the continued difficulty with right leg symptoms.

## 2020-06-29 DIAGNOSIS — R2689 Other abnormalities of gait and mobility: Secondary | ICD-10-CM | POA: Diagnosis not present

## 2020-06-29 DIAGNOSIS — G8929 Other chronic pain: Secondary | ICD-10-CM | POA: Diagnosis not present

## 2020-06-29 DIAGNOSIS — M5416 Radiculopathy, lumbar region: Secondary | ICD-10-CM | POA: Diagnosis not present

## 2020-06-29 DIAGNOSIS — M545 Low back pain, unspecified: Secondary | ICD-10-CM | POA: Diagnosis not present

## 2020-06-29 LAB — COMPREHENSIVE METABOLIC PANEL
ALT: 16 IU/L (ref 0–44)
AST: 13 IU/L (ref 0–40)
Albumin/Globulin Ratio: 1.8 (ref 1.2–2.2)
Albumin: 4.4 g/dL (ref 3.7–4.7)
Alkaline Phosphatase: 92 IU/L (ref 44–121)
BUN/Creatinine Ratio: 16 (ref 10–24)
BUN: 15 mg/dL (ref 8–27)
Bilirubin Total: 0.3 mg/dL (ref 0.0–1.2)
CO2: 25 mmol/L (ref 20–29)
Calcium: 9.3 mg/dL (ref 8.6–10.2)
Chloride: 102 mmol/L (ref 96–106)
Creatinine, Ser: 0.93 mg/dL (ref 0.76–1.27)
GFR calc Af Amer: 93 mL/min/{1.73_m2} (ref >59)
GFR calc non Af Amer: 81 mL/min/{1.73_m2} (ref >59)
Globulin, Total: 2.5 g/dL (ref 1.5–4.5)
Glucose: 109 mg/dL — ABNORMAL HIGH (ref 65–99)
Potassium: 4.2 mmol/L (ref 3.5–5.2)
Sodium: 141 mmol/L (ref 134–144)
Total Protein: 6.9 g/dL (ref 6.0–8.5)

## 2020-06-29 LAB — CBC WITH DIFFERENTIAL/PLATELET
Basophils Absolute: 0 10*3/uL (ref 0.0–0.2)
Basos: 0 %
EOS (ABSOLUTE): 0 10*3/uL (ref 0.0–0.4)
Eos: 0 %
Hematocrit: 41.7 % (ref 37.5–51.0)
Hemoglobin: 14 g/dL (ref 13.0–17.7)
Immature Grans (Abs): 0 10*3/uL (ref 0.0–0.1)
Immature Granulocytes: 0 %
Lymphocytes Absolute: 0.6 10*3/uL — ABNORMAL LOW (ref 0.7–3.1)
Lymphs: 11 %
MCH: 30 pg (ref 26.6–33.0)
MCHC: 33.6 g/dL (ref 31.5–35.7)
MCV: 89 fL (ref 79–97)
Monocytes Absolute: 0.3 10*3/uL (ref 0.1–0.9)
Monocytes: 4 %
Neutrophils Absolute: 4.8 10*3/uL (ref 1.4–7.0)
Neutrophils: 85 %
Platelets: 300 10*3/uL (ref 150–450)
RBC: 4.67 x10E6/uL (ref 4.14–5.80)
RDW: 13.2 % (ref 11.6–15.4)
WBC: 5.7 10*3/uL (ref 3.4–10.8)

## 2020-06-29 LAB — TSH: TSH: 1.73 u[IU]/mL (ref 0.450–4.500)

## 2020-06-30 ENCOUNTER — Telehealth: Payer: Self-pay | Admitting: Neurology

## 2020-06-30 NOTE — Telephone Encounter (Signed)
Pt left message with the after hour service on 06-29-20   Caller states that he had spinal surgery and got imaging back in the summer through her his other providers want him to have more imaging and he wants to sch the MRI to e done. He is having shaking and is concern about parkinson please call

## 2020-06-30 NOTE — Telephone Encounter (Signed)
Patient needs to be seen, place on waiting list?

## 2020-06-30 NOTE — Telephone Encounter (Signed)
He will need to follow up with his neurosurgeon then.

## 2020-06-30 NOTE — Telephone Encounter (Signed)
Patient advised.

## 2020-07-06 ENCOUNTER — Other Ambulatory Visit: Payer: Self-pay

## 2020-07-06 ENCOUNTER — Telehealth: Payer: Self-pay | Admitting: Family Medicine

## 2020-07-06 DIAGNOSIS — R202 Paresthesia of skin: Secondary | ICD-10-CM

## 2020-07-06 DIAGNOSIS — M5416 Radiculopathy, lumbar region: Secondary | ICD-10-CM

## 2020-07-06 NOTE — Telephone Encounter (Signed)
Sorry wrong provider

## 2020-07-06 NOTE — Telephone Encounter (Signed)
Is this me or Dr. Susann Givens?

## 2020-07-06 NOTE — Telephone Encounter (Signed)
Done KH 

## 2020-07-06 NOTE — Telephone Encounter (Signed)
Go ahead and take care of this

## 2020-07-06 NOTE — Telephone Encounter (Signed)
Pt called and states that he needs another referral for [facility] states they want see him until they have that   Pt can be reached at [phone]

## 2020-07-13 DIAGNOSIS — M545 Low back pain, unspecified: Secondary | ICD-10-CM | POA: Diagnosis not present

## 2020-07-13 DIAGNOSIS — M5416 Radiculopathy, lumbar region: Secondary | ICD-10-CM | POA: Diagnosis not present

## 2020-07-19 DIAGNOSIS — M5126 Other intervertebral disc displacement, lumbar region: Secondary | ICD-10-CM | POA: Diagnosis not present

## 2020-07-19 DIAGNOSIS — R2689 Other abnormalities of gait and mobility: Secondary | ICD-10-CM | POA: Diagnosis not present

## 2020-07-19 DIAGNOSIS — M5416 Radiculopathy, lumbar region: Secondary | ICD-10-CM | POA: Diagnosis not present

## 2020-07-19 DIAGNOSIS — R251 Tremor, unspecified: Secondary | ICD-10-CM | POA: Diagnosis not present

## 2020-07-25 ENCOUNTER — Telehealth: Payer: Self-pay

## 2020-07-25 NOTE — Telephone Encounter (Signed)
LVM for pt to come by and pick up parking application.[place]

## 2020-07-26 ENCOUNTER — Other Ambulatory Visit: Payer: Self-pay

## 2020-07-26 DIAGNOSIS — R296 Repeated falls: Secondary | ICD-10-CM

## 2020-07-26 DIAGNOSIS — R251 Tremor, unspecified: Secondary | ICD-10-CM

## 2020-07-26 DIAGNOSIS — W07XXXA Fall from chair, initial encounter: Secondary | ICD-10-CM

## 2020-07-28 ENCOUNTER — Ambulatory Visit: Payer: PPO | Admitting: Family Medicine

## 2020-07-29 ENCOUNTER — Encounter: Payer: Self-pay | Admitting: Family Medicine

## 2020-08-01 NOTE — Progress Notes (Unsigned)
Assessment/Plan:   1.  Spasticity with falls and fasiculations and ?fecal incontinence  -already had MRI brain and lumbar spine  -MRI cervical spine/MRI thoracic spine to be done  -EMG  -don't think that he has Parkinsons Disease.  Doesn't meet criteria.  -the R foot drop and RLE paresthesias may be due to prior surgery and we discussed that today   Subjective:   Starsky Nanna was seen today in follow up for tremor and gait and balance change.  I have not seen the patient in about a year and a half (only have seen him one time).  My previous records were reviewed prior to todays visit as well as outside records available to me. Pt with wife who supplements hx.  Patient previously had an MRI of the brain after he saw me and that was unremarkable.  He also had an MRI of the lumbar spine and this demonstrated a right-sided disc protrusion with impingement of the right L5 nerve root.  EMG was negative.  He called several months after he saw's and wanted to be referred for injections given pain.  We referred him to [facility].  He has had surgery since that time.  However, he is still having R leg tingling and is still falling and having more trouble with tremor and they apparently ordered another MRI of the lumbar spine that was unrevealing.  Patient was worried about Parkinson's disease and they sent him back for further evaluation. Pt states that when he starts to get up and move, the R leg leg will stick to the floor for the initial step.  He does fall - may go 3 weeks without a fall and then will have a fall.  Most of falls are backward.  Trouble with steps without a handrail.  Trouble with getting OOC/car.  Pt not exercising right now due to moving to new location.  Tremor with activation only - trouble writing.  No diplopia.  No lightheadedness/near syncope.  No swallow issues.  Has lost about 20 lbs since June - states had less appetite but states appetite is coming back.  Noting some  "fecal incontinence" (if trying to pee he will start having a BM) but otherwise doesn't have loss of bowel control.  More loose stool   ALLERGIES:  No Known Allergies  CURRENT MEDICATIONS:  Outpatient Encounter Medications as of 08/03/2020  Medication Sig  . aspirin 81 MG tablet Take 81 mg by mouth daily.  . diclofenac (VOLTAREN) 75 MG EC tablet Take 75 mg by mouth 2 (two) times daily.  . rosuvastatin (CRESTOR) 10 MG tablet TAKE 1 TABLET BY MOUTH EVERY DAY  . tamsulosin (FLOMAX) 0.4 MG CAPS capsule Take 1 capsule (0.4 mg total) by mouth daily.  . [DISCONTINUED] HYDROcodone-acetaminophen (NORCO/VICODIN) 5-325 MG tablet Take 1 tablet by mouth every 6 (six) hours as needed for moderate pain. (Patient not taking: Reported on 08/03/2020)  . [DISCONTINUED] meloxicam (MOBIC) 15 MG tablet Take 15 mg by mouth daily.  (Patient not taking: Reported on 08/03/2020)  . [DISCONTINUED] methocarbamol (ROBAXIN) 500 MG tablet Take 500 mg by mouth 3 (three) times daily. (Patient not taking: Reported on 08/03/2020)  . [DISCONTINUED] terbinafine (LAMISIL) 250 MG tablet Take 1 tablet (250 mg total) by mouth daily. (Patient not taking: No sig reported)   No facility-administered encounter medications on file as of 08/03/2020.    Objective:   PHYSICAL EXAMINATION:    VITALS:   Vitals:   08/03/20 0808  BP: 132/68  Pulse: 94  SpO2: 98%  Weight: 181 lb (82.1 kg)  Height: 5\' 9"  (1.753 m)    GEN:  The patient appears stated age and is in NAD. HEENT:  Normocephalic, atraumatic.  The mucous membranes are moist. The superficial temporal arteries are without ropiness or tenderness. CV:  RRR Lungs:  CTAB Neck/HEME:  There are no carotid bruits bilaterally.  Pt undressed and placed in exam shorts.  Neurological examination:  Orientation: The patient is alert and oriented x3. Cranial nerves: There is good facial symmetry without facial hypomimia. The speech is fluent and clear. No hypophonia.  Soft palate rises  symmetrically and there is no tongue deviation. Hearing is intact to conversational tone. Motor: Sensation is 5/5 in the ue/le.  He has R foot drop.  Has some few fasciculations in the R deltoid Sensory:  Does report decreased generalized sensation below the R knee (but not in a particular sensory dermatomal level) DTR:  +2/4 at the bilateral bicep, tricep, brachiorad, 2+-3 at the right patella and 2/4 at the left patella  Movement examination: Tone: There is nl tone in the ue/le Abnormal movements: none Coordination:  There is no decremation with RAM's, except with foot taps on the right (due to foot drop) Gait and Station: The patient has  difficulty arising out of a deep-seated chair without the use of the hands. The patient's stride length is decreased but the R leg is appears spastic (abducts at hip) with foot drop.  Unstable with ambulation  I have reviewed and interpreted the following labs independently    Chemistry      Component Value Date/Time   NA 141 06/28/2020 1503   K 4.2 06/28/2020 1503   CL 102 06/28/2020 1503   CO2 25 06/28/2020 1503   BUN 15 06/28/2020 1503   CREATININE 0.93 06/28/2020 1503   CREATININE 1.04 12/21/2016 0825      Component Value Date/Time   CALCIUM 9.3 06/28/2020 1503   ALKPHOS 92 06/28/2020 1503   AST 13 06/28/2020 1503   ALT 16 06/28/2020 1503   BILITOT 0.3 06/28/2020 1503       Lab Results  Component Value Date   WBC 5.7 06/28/2020   HGB 14.0 06/28/2020   HCT 41.7 06/28/2020   MCV 89 06/28/2020   PLT 300 06/28/2020    Lab Results  Component Value Date   TSH 1.730 06/28/2020     Total time spent on today's visit was 45 minutes, including both face-to-face time and nonface-to-face time.  Time included that spent on review of records (prior notes available to me/labs/imaging if pertinent), discussing treatment and goals, answering patient's questions and coordinating care.  Cc:  Denita Lung, MD

## 2020-08-03 ENCOUNTER — Ambulatory Visit: Payer: PPO | Admitting: Neurology

## 2020-08-03 ENCOUNTER — Other Ambulatory Visit: Payer: Self-pay

## 2020-08-03 ENCOUNTER — Encounter: Payer: Self-pay | Admitting: Neurology

## 2020-08-03 VITALS — BP 132/68 | HR 94 | Ht 69.0 in | Wt 181.0 lb

## 2020-08-03 DIAGNOSIS — M542 Cervicalgia: Secondary | ICD-10-CM | POA: Diagnosis not present

## 2020-08-03 DIAGNOSIS — R253 Fasciculation: Secondary | ICD-10-CM

## 2020-08-03 DIAGNOSIS — R159 Full incontinence of feces: Secondary | ICD-10-CM | POA: Diagnosis not present

## 2020-08-03 DIAGNOSIS — M549 Dorsalgia, unspecified: Secondary | ICD-10-CM | POA: Diagnosis not present

## 2020-08-03 DIAGNOSIS — R292 Abnormal reflex: Secondary | ICD-10-CM | POA: Diagnosis not present

## 2020-08-03 DIAGNOSIS — R252 Cramp and spasm: Secondary | ICD-10-CM | POA: Diagnosis not present

## 2020-08-03 NOTE — Patient Instructions (Addendum)
A referral to [facility] has been placed for your MRI someone will contact you directly to schedule your appt. They are located at [place]. Please contact them directly by calling [phone] with any questions regarding your referral.   Schedule a EEG.   Your physician recommends that you schedule a follow-up appointment in: will be determined after testing has been completed.

## 2020-08-10 ENCOUNTER — Ambulatory Visit: Payer: PPO | Admitting: Neurology

## 2020-08-10 ENCOUNTER — Other Ambulatory Visit: Payer: Self-pay

## 2020-08-10 DIAGNOSIS — R253 Fasciculation: Secondary | ICD-10-CM | POA: Diagnosis not present

## 2020-08-10 DIAGNOSIS — R2681 Unsteadiness on feet: Secondary | ICD-10-CM

## 2020-08-10 NOTE — Procedures (Signed)
[facility] Neurology  [address]  [place], [place] [address] Tel: [phone] Fax:  [phone] Test Date:  08/10/2020  Patient: Bernard Reed DOB: 14-Feb-1946 Physician: Narda Amber, DO  Sex: Male Height: 5\' 9"  Ref Phys: Alonza Bogus, D.O.  ID#: [id number]   Technician:    Patient Complaints: This is a 75 year old man referred for evaluation of gait instability and falls.  NCV & EMG Findings: Extensive electrodiagnostic testing of the right lower extremity and additional studies of the left shows:  1. Bilateral sural and superficial peroneal sensory responses are within normal limits. 2. Bilateral peroneal and tibial motor responses are within normal limits. 3. Bilateral tibial H reflex studies are within normal limits. 4. There is no evidence of active or chronic motor axonal changes affecting any of the tested muscles.  Motor unit configuration and recruitment pattern is within normal limits.  Impression: This is a normal study of the lower extremities; findings are unchanged from prior study on 05/12/2019.   In particular, there is no evidence of a sensorimotor polyneuropathy, widespread disorder of anterior horn cells, or lumbosacral radiculopathy.   ___________________________ Narda Amber, DO    Nerve Conduction Studies Anti Sensory Summary Table   Stim Site NR Peak (ms) Norm Peak (ms) P-T Amp (V) Norm P-T Amp  Left Sup Peroneal Anti Sensory (Ant Lat Mall)  34C  12 cm    2.5 <4.6 11.3 >3  Right Sup Peroneal Anti Sensory (Ant Lat Mall)  34C  12 cm    2.9 <4.6 10.8 >3  Left Sural Anti Sensory (Lat Mall)  34C  Calf    3.3 <4.6 18.6 >3  Right Sural Anti Sensory (Lat Mall)  34C  Calf    2.9 <4.6 20.0 >3   Motor Summary Table   Stim Site NR Onset (ms) Norm Onset (ms) O-P Amp (mV) Norm O-P Amp Site1 Site2 Delta-0 (ms) Dist (cm) Vel (m/s) Norm Vel (m/s)  Left Peroneal Motor (Ext Dig Brev)  34C  Ankle    3.8 <6.0 7.7 >2.5 B Fib Ankle 8.1 39.0 48  >40  B Fib    11.9  5.9  Poplt B Fib 2.1 10.0 48 >40  Poplt    14.0  5.6         Right Peroneal Motor (Ext Dig Brev)  34C  Ankle    3.4 <6.0 9.1 >2.5 B Fib Ankle 8.6 38.0 44 >40  B Fib    12.0  8.1  Poplt B Fib 2.0 9.0 45 >40  Poplt    14.0  7.9         Left Peroneal TA Motor (Tib Ant)  34C  Fib Head    3.1 <4.5 7.5 >3 Poplit Fib Head 1.7 10.0 59 >40  Poplit    4.8  7.2         Right Peroneal TA Motor (Tib Ant)  34C  Fib Head    3.5 <4.5 7.2 >3 Poplit Fib Head 1.4 9.0 64 >40  Poplit    4.9  7.0         Left Tibial Motor (Abd Hall Brev)  34C  Ankle    4.5 <6.0 11.7 >4 Knee Ankle 9.4 43.0 46 >40  Knee    13.9  7.9         Right Tibial Motor (Abd Hall Brev)  34C  Ankle    3.6 <6.0 8.2 >4 Knee Ankle 9.2 45.0 49 >40  Knee    12.8  6.6          H Reflex Studies   NR H-Lat (ms) Lat Norm (ms) L-R H-Lat (ms)  Left Tibial (Gastroc)  34C     34.29 <35 0.00  Right Tibial (Gastroc)  34C     34.29 <35 0.00   EMG   Side Muscle Ins Act Fibs Psw Fasc Number Recrt Dur Dur. Amp Amp. Poly Poly. Comment  Right AntTibialis Nml Nml Nml Nml Nml Nml Nml Nml Nml Nml Nml Nml N/A  Right Gastroc Nml Nml Nml Nml Nml Nml Nml Nml Nml Nml Nml Nml N/A  Right Flex Dig Long Nml Nml Nml Nml Nml Nml Nml Nml Nml Nml Nml Nml N/A  Right GluteusMed Nml Nml Nml Nml Nml Nml Nml Nml Nml Nml Nml Nml N/A  Right RectFemoris Nml Nml Nml Nml Nml Nml Nml Nml Nml Nml Nml Nml N/A  Left AntTibialis Nml Nml Nml Nml Nml Nml Nml Nml Nml Nml Nml Nml N/A  Left Gastroc Nml Nml Nml Nml Nml Nml Nml Nml Nml Nml Nml Nml N/A  Left Flex Dig Long Nml Nml Nml Nml Nml Nml Nml Nml Nml Nml Nml Nml N/A  Left RectFemoris Nml Nml Nml Nml Nml Nml Nml Nml Nml Nml Nml Nml N/A  Left GluteusMed Nml Nml Nml Nml Nml Nml Nml Nml Nml Nml Nml Nml N/A      Waveforms:

## 2020-08-17 ENCOUNTER — Other Ambulatory Visit: Payer: Self-pay

## 2020-08-17 ENCOUNTER — Encounter: Payer: Self-pay | Admitting: Internal Medicine

## 2020-08-17 ENCOUNTER — Ambulatory Visit: Payer: PPO | Admitting: Internal Medicine

## 2020-08-17 VITALS — BP 150/60 | HR 88 | Ht 69.0 in | Wt 183.0 lb

## 2020-08-17 DIAGNOSIS — R159 Full incontinence of feces: Secondary | ICD-10-CM

## 2020-08-17 DIAGNOSIS — Z86010 Personal history of colonic polyps: Secondary | ICD-10-CM

## 2020-08-17 NOTE — Progress Notes (Signed)
HISTORY OF PRESENT ILLNESS:  Bernard Reed is a 75 y.o. 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  He also has a history of multiple adenomatous colon polyps for which she is undergone previous colonoscopy in 2007, 2010, 2013, and 2018.  His last examination revealed diverticulosis and diminutive colon polyps.  Repeat follow-up in 5 years recommended.  Patient presents today with chief complaint of intermittent fecal incontinence when standing to urinate.  He states that this began approximately 5 months ago.  He also mentions that his stools have been less bulky and more loose.  He has had significant neurologic sequelae including dropfoot from his lumbar spine disease.  Over the past year or so he reports decreased appetite and 20 pound weight loss.  No abdominal pain.  No bleeding.  He reports about 5 bowel movements per day.  No nocturnal problems.  No problems with incontinence at times other than when he is urinating.  He has completed his COVID vaccination series and booster.  Review of blood work from December 2021 shows unremarkable comprehensive metabolic panel.  Normal CBC with hemoglobin 14.0.  X-rays are reviewed including MRI of the lumbar spine.  Negative EMG.  REVIEW OF SYSTEMS:  All non-GI ROS negative unless otherwise stated in the HPI except for urinary leakage  Past Medical History:  Diagnosis Date  . Coronary artery disease    NONOBSTRUCTIVE  . Gallbladder disease   . GERD (gastroesophageal reflux disease)   . Hyperlipidemia   . Migraine aura without headache   . Prostate cancer ([place])   . Seasonal allergies     Past Surgical History:  Procedure Laterality Date  . CARDIAC CATHETERIZATION  07/16/2006   EF 50-55%  . CARDIOVASCULAR STRESS TEST  07/08/2006   EF 57%  . CATARACT EXTRACTION, BILATERAL    . COLONOSCOPY   09/14/2008  . CYSTOSCOPY N/A 03/25/2019   Procedure: CYSTOSCOPY;  Surgeon: Ardis Hughs, MD;  Location: [facility];  Service: Urology;  Laterality: N/A;  No seeds detected in bladder per Dr. Louis Meckel  . LAPAROSCOPIC CHOLECYSTECTOMY  2008  . PROSTATE BIOPSY  2009  . PROSTATE BIOPSY  06/27/2012  . PROSTATE BIOPSY  06/08/2013  . PROSTATE BIOPSY  03/19/2016  . PROSTATE BIOPSY  04/17/2018  . RADIOACTIVE SEED IMPLANT N/A 03/25/2019   Procedure: RADIOACTIVE SEED IMPLANT/BRACHYTHERAPY IMPLANT;  Surgeon: Ardis Hughs, MD;  Location: [facility];  Service: Urology;  Laterality: N/A;  . SPACE OAR INSTILLATION N/A 03/25/2019   Procedure: SPACE OAR INSTILLATION;  Surgeon: Ardis Hughs, MD;  Location: [facility];  Service: Urology;  Laterality: N/A;    Social History Bernard Reed  reports that he quit smoking about 49 years ago. His smoking use included cigarettes. He has a 16.50 pack-year smoking history. He has never used smokeless tobacco. He reports current alcohol use of about 3.0 standard drinks of alcohol per week. He reports that he does not use drugs.  family history includes Heart attack in his father; Heart disease in his father and mother; Hypertension in his father.  No Known Allergies     PHYSICAL EXAMINATION: Vital signs: BP (!) 150/60   Pulse 88   Ht 5\' 9"  (1.753 m)   Wt 183 lb (83 kg)   SpO2 97%   BMI 27.02 kg/m   Constitutional: generally well-appearing, no acute distress Psychiatric:  alert and oriented x3, cooperative Eyes: extraocular movements intact, anicteric, conjunctiva pink Mouth: oral pharynx moist, no lesions Neck: supple no lymphadenopathy Cardiovascular: heart regular rate and rhythm, no murmur Lungs: clear to auscultation bilaterally Abdomen: soft, nontender, nondistended, no obvious ascites, no peritoneal signs, normal bowel sounds, no organomegaly Rectal: Omitted per patient  preference Extremities: no clubbing, cyanosis, or lower extremity edema bilaterally Skin: no lesions on visible extremities Neuro: Right foot drop.  Cranial nerves intact  ASSESSMENT:  1.  Fecal incontinence intermittently with urination.  Certainly due to decreased rectal sphincter tone with increased Valsalva pressure during urination.  Exacerbated by more loose stools. 2.  History of multiple adenomatous colon polyps.  Last colonoscopy May 2018 3.  L4-5 lumbar disc disease status post minimally invasive surgery July 2021.  Right foot drop right-sided paresthesias due to the same  4.  Prostate cancer status post radioactive seed implantation September 2020   PLAN:  1.  Improve stool consistency with Metamucil 2 tablespoons daily 2.  Advised to sit while urinating 3.  Surveillance colonoscopy around May 2023 4.  Interval follow-up as needed.  He agrees A total time 45 minutes was spent preparing to see the patient, reviewing outside test, x-rays, laboratories, surgical interventions.  Obtaining comprehensive history and performing medically appropriate physical examination.  Counseling patient regarding his wellness issues.  Ordering and instructing therapies.  Documenting clinical information in health record.

## 2020-08-17 NOTE — Patient Instructions (Signed)
Take two tablespoons of Metamucil daily  Please follow up as needed

## 2020-08-18 ENCOUNTER — Ambulatory Visit
Admission: RE | Admit: 2020-08-18 | Discharge: 2020-08-18 | Disposition: A | Discharge status: Home / Self Care | Payer: PPO | Source: Ambulatory Visit | Attending: Neurology | Admitting: Neurology

## 2020-08-18 ENCOUNTER — Other Ambulatory Visit: Payer: Self-pay

## 2020-08-18 DIAGNOSIS — M4802 Spinal stenosis, cervical region: Secondary | ICD-10-CM | POA: Diagnosis not present

## 2020-08-18 DIAGNOSIS — R200 Anesthesia of skin: Secondary | ICD-10-CM | POA: Diagnosis not present

## 2020-08-18 DIAGNOSIS — R531 Weakness: Secondary | ICD-10-CM | POA: Diagnosis not present

## 2020-08-19 ENCOUNTER — Telehealth: Payer: Self-pay | Admitting: Neurology

## 2020-08-19 NOTE — Telephone Encounter (Signed)
Spoke with patient and scheduled a follow up visit on 2/28 @ 2:30pm per Dr Doristine Devoid instructions. Patient stated he would prefer an in office visit.

## 2020-08-19 NOTE — Telephone Encounter (Signed)
Please call patient and put him in on 2/28 at 2:30 (if still avail).  VV is ok, to discuss results and next steps

## 2020-08-19 NOTE — Telephone Encounter (Signed)
ok 

## 2020-08-23 ENCOUNTER — Other Ambulatory Visit: Payer: PPO

## 2020-08-26 NOTE — Progress Notes (Signed)
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  -EMG unrevealing.  -We will do a DaTscan.  Purpose of the DaTscan would be to look at atypical state.  -The right foot drop and right lower extremity paresthesias may be due to prior surgery   Subjective:   Bernard Reed was seen today in follow up for falls.  My previous records as well as any outside records available were reviewed prior to todays visit.  This patient is accompanied in the office by his spouse who supplements the history.  Patient has had a normal EMG done since our last visit.  MRI cervical (some degenerative changes) and thoracic spine have been fairly unremarkable.  Patient saw Dr. Henrene Pastor and it was felt that he really did not have frank fecal incontinence, but rather fecal incontinence intermittently with urination that was due to decreased rectal sphincter tone associated with increased Valsalva pressure during urination.  The plan was to improve stool consistency with Metamucil.  Since last visit pt has had 2-3 falls.  One getting out of the car.  Fell backward.  One in the garage.  States that he has less trouble walking in home on level surface but there is a lip/step in the garage and fell there.  He is wearing an AFO now and that does seem to help.  Has trouble with the R leg wanting to "stick" esp with the first few steps.   PREVIOUS MEDICATIONS: none to date  CURRENT MEDICATIONS:  Outpatient Encounter Medications as of 08/29/2020  Medication Sig  . aspirin 81 MG tablet Take 81 mg by mouth daily.  . diclofenac (VOLTAREN) 75 MG EC tablet Take 75 mg by mouth 2 (two) times daily.  . rosuvastatin (CRESTOR) 10 MG tablet TAKE 1 TABLET BY MOUTH EVERY DAY  . tamsulosin (FLOMAX) 0.4 MG CAPS capsule Take 1 capsule (0.4 mg total) by mouth daily.   No facility-administered encounter medications on file as of 08/29/2020.      Objective:   PHYSICAL EXAMINATION:    VITALS:   Vitals:   08/29/20 1435  BP: (!) 162/72  Pulse: 83  SpO2: 97%  Weight: 181 lb (82.1 kg)  Height: 5\' 9"  (1.753 m)    GEN:  The patient appears stated age and is in NAD. HEENT:  Normocephalic, atraumatic.  The mucous membranes are moist. The superficial temporal arteries are without ropiness or tenderness. CV:  RRR Lungs:  CTAB Neck/HEME:  There are no carotid bruits bilaterally.  Neurological examination:  Orientation: The patient is alert and oriented x3. Cranial nerves: There is good facial symmetry.The speech is fluent and clear. Soft palate rises symmetrically and there is no tongue deviation. Hearing is intact to conversational tone. Sensation: Sensation is intact to light touch throughout Motor: Strength is at least antigravity x4.  Movement examination: Tone: There is mild increased tone in the RUE Abnormal movements: none (tremor felt and not seen) Coordination:  There is no decremation with RAM's, except with foot taps on the right (due to foot drop) Gait and Station: The patient has  difficulty arising out of a deep-seated chair without the use of the hands. He nearly falls back when trying to get up but catches himself on the chair.  Gait is wide based and ataxic.  R foot with AFO d/t foot drop.  He is unstable.      Total time spent on today's visit was 35  minutes, including both face-to-face time and nonface-to-face time.  Time included that spent on review of records (prior notes available to me/labs/imaging if pertinent), discussing treatment and goals, answering patient's questions and coordinating care.  Cc:  Denita Lung, MD

## 2020-08-29 ENCOUNTER — Other Ambulatory Visit: Payer: Self-pay

## 2020-08-29 ENCOUNTER — Encounter: Payer: Self-pay | Admitting: Neurology

## 2020-08-29 ENCOUNTER — Ambulatory Visit: Payer: PPO | Admitting: Neurology

## 2020-08-29 VITALS — BP 162/72 | HR 83 | Ht 69.0 in | Wt 181.0 lb

## 2020-08-29 DIAGNOSIS — R251 Tremor, unspecified: Secondary | ICD-10-CM | POA: Diagnosis not present

## 2020-08-29 DIAGNOSIS — R2681 Unsteadiness on feet: Secondary | ICD-10-CM

## 2020-08-29 NOTE — Addendum Note (Signed)
Addended by: Armen Pickup A on: 08/29/2020 03:45 PM   Modules accepted: Orders

## 2020-09-01 ENCOUNTER — Telehealth: Payer: Self-pay | Admitting: Neurology

## 2020-09-01 NOTE — Telephone Encounter (Signed)
Can we get him into PT?  Is his DaT scan scheduled?

## 2020-09-01 NOTE — Telephone Encounter (Signed)
Patient wife wants to speak to someone about the patient falling 5 times in 7 days.  She states that she can hardly get him up when he falls and she is worried that he might hurt himself when he falls so much please call

## 2020-09-02 ENCOUNTER — Telehealth: Payer: Self-pay | Admitting: Neurology

## 2020-09-02 NOTE — Telephone Encounter (Signed)
Patient calling to check if his DaT scan has been scheduled yet. Please call.

## 2020-09-02 NOTE — Telephone Encounter (Signed)
Does [facility] except [facility]?  If not, try [facility].

## 2020-09-02 NOTE — Telephone Encounter (Signed)
Referral faxed

## 2020-09-02 NOTE — Telephone Encounter (Signed)
Where do you want me to send him for PT and Ive sent the scheduler a message to schedule his DaTscan. He should be getting a call soon.

## 2020-09-02 NOTE — Telephone Encounter (Signed)
See below.  [facility]

## 2020-09-02 NOTE — Telephone Encounter (Addendum)
Left detailed message informing patient that a scheduler will be in contact with him soon to get his DaTscan scheduled. Advised him that Dr Tat would also like for him to do physical therapy and someone will be in touch with him about that, ok per DPR, and to call back if any questions.

## 2020-09-02 NOTE — Telephone Encounter (Signed)
I spoke with Abigail Butts at [facility] and she states she does not have anyone that can do PT right now only occupational therapy. Where would you like for me to send the patient? -new direction

## 2020-09-05 ENCOUNTER — Other Ambulatory Visit: Payer: Self-pay

## 2020-09-05 DIAGNOSIS — R2681 Unsteadiness on feet: Secondary | ICD-10-CM

## 2020-09-07 DIAGNOSIS — R251 Tremor, unspecified: Secondary | ICD-10-CM | POA: Diagnosis not present

## 2020-09-07 DIAGNOSIS — R2681 Unsteadiness on feet: Secondary | ICD-10-CM | POA: Diagnosis not present

## 2020-09-07 DIAGNOSIS — R262 Difficulty in walking, not elsewhere classified: Secondary | ICD-10-CM | POA: Diagnosis not present

## 2020-09-07 DIAGNOSIS — M6281 Muscle weakness (generalized): Secondary | ICD-10-CM | POA: Diagnosis not present

## 2020-09-07 DIAGNOSIS — Z9181 History of falling: Secondary | ICD-10-CM | POA: Diagnosis not present

## 2020-09-14 ENCOUNTER — Encounter (HOSPITAL_COMMUNITY)
Admission: RE | Admit: 2020-09-14 | Discharge: 2020-09-14 | Disposition: A | Discharge status: Home / Self Care | Payer: PPO | Source: Ambulatory Visit | Attending: Neurology | Admitting: Neurology

## 2020-09-14 ENCOUNTER — Other Ambulatory Visit: Payer: Self-pay

## 2020-09-14 DIAGNOSIS — R296 Repeated falls: Secondary | ICD-10-CM | POA: Diagnosis not present

## 2020-09-14 DIAGNOSIS — G20 Parkinson's disease: Secondary | ICD-10-CM | POA: Diagnosis not present

## 2020-09-14 DIAGNOSIS — R251 Tremor, unspecified: Secondary | ICD-10-CM | POA: Insufficient documentation

## 2020-09-14 MED ORDER — POTASSIUM IODIDE (ANTIDOTE) 130 MG PO TABS: 130.0000 mg | ORAL_TABLET | Freq: Once | ORAL | Status: AC

## 2020-09-14 MED ORDER — POTASSIUM IODIDE (ANTIDOTE) 130 MG PO TABS
ORAL_TABLET | ORAL | Status: AC
  Administered 2020-09-14: 130 mg via ORAL
  Filled 2020-09-14: qty 1

## 2020-09-14 MED ORDER — IOFLUPANE I 123 185 MBQ/2.5ML IV SOLN
4.2500 | Freq: Once | INTRAVENOUS | Status: AC | PRN
  Administered 2020-09-14: 4.25 via INTRAVENOUS
  Filled 2020-09-14: qty 5

## 2020-09-15 ENCOUNTER — Telehealth: Payer: Self-pay | Admitting: Neurology

## 2020-09-15 NOTE — Telephone Encounter (Signed)
Pt is sch for 09-19-20

## 2020-09-15 NOTE — Telephone Encounter (Signed)
Put pt in on Monday to discuss DaT scan results

## 2020-09-16 NOTE — Progress Notes (Signed)
   Assessment/Plan:   1.  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  -EMG unrevealing.  -The right foot drop and right lower extremity paresthesias may be due to prior surgery  -start carbidopa/levodopa 25/100 at 7am/11am/4pm, although not always helpful in atypical states and may require higher dosages.  -talked about safe CV exercise  -handicap form filled out  -recommend counseling for pt and wife independently.  Marital stress appears to be an issue here and think needs addressed in relation to long term neurodegen process.   Subjective:   Bernard Reed was seen today in follow up for falls.  My previous records as well as any outside records available were reviewed prior to todays visit.  This patient is accompanied in the office by his spouse who supplements the history.  Patient had DaTscan completed on September 14, 2020.  I personally reviewed this.  There was asymmetric, decreased activity in the L>R striatum.   I personally reviewed that.   PREVIOUS MEDICATIONS: none to date  CURRENT MEDICATIONS:  Outpatient Encounter Medications as of 09/19/2020  Medication Sig  . aspirin 81 MG tablet Take 81 mg by mouth daily.  . diclofenac (VOLTAREN) 75 MG EC tablet Take 75 mg by mouth 2 (two) times daily.  . rosuvastatin (CRESTOR) 10 MG tablet TAKE 1 TABLET BY MOUTH EVERY DAY  . tamsulosin (FLOMAX) 0.4 MG CAPS capsule Take 1 capsule (0.4 mg total) by mouth daily.   No facility-administered encounter medications on file as of 09/19/2020.     Objective:   PHYSICAL EXAMINATION:    VITALS:   Vitals:   09/19/20 0904  BP: (!) 118/58  Pulse: 88  SpO2: 98%  Weight: 180 lb (81.6 kg)  Height: 5\' 9"  (1.753 m)    GEN:  The patient appears stated age and is in NAD. HEENT:  Normocephalic, atraumatic.  The mucous membranes are moist. The superficial temporal arteries are without ropiness or tenderness. CV:   RRR Lungs:  CTAB Neck/HEME:  There are no carotid bruits bilaterally.  Neurological examination:  Orientation: The patient is alert and oriented x3. Cranial nerves: There is good facial symmetry.The speech is fluent and clear. Soft palate rises symmetrically and there is no tongue deviation. Hearing is intact to conversational tone. Sensation: Sensation is intact to light touch throughout Motor: Strength is at least antigravity x4.  Movement examination: Tone: There is mild increased tone in the RUE Abnormal movements: none (tremor felt and not seen) Coordination:  There is no decremation with RAM's, except with foot taps on the right (due to foot drop) Gait and Station: Patient pushes off of the chair to arise.  Walks fairly well with the walker.  Turns en bloc.    Total time spent on today's visit was 40 minutes, including both face-to-face time and nonface-to-face time.  Time included that spent on review of records (prior notes available to me/labs/imaging if pertinent), discussing treatment and goals, answering patient's questions and coordinating care.  Cc:  Denita Lung, MD

## 2020-09-19 ENCOUNTER — Other Ambulatory Visit: Payer: Self-pay

## 2020-09-19 ENCOUNTER — Encounter: Payer: Self-pay | Admitting: Neurology

## 2020-09-19 ENCOUNTER — Ambulatory Visit: Payer: PPO | Admitting: Neurology

## 2020-09-19 VITALS — BP 118/58 | HR 88 | Ht 69.0 in | Wt 180.0 lb

## 2020-09-19 DIAGNOSIS — G20C Parkinsonism, unspecified: Secondary | ICD-10-CM

## 2020-09-19 DIAGNOSIS — G20 Parkinson's disease: Secondary | ICD-10-CM | POA: Diagnosis not present

## 2020-09-19 MED ORDER — CARBIDOPA-LEVODOPA 25-100 MG PO TABS: 1.0000 | ORAL_TABLET | Freq: Three times a day (TID) | ORAL | 1 refills | Status: DC

## 2020-09-19 NOTE — Patient Instructions (Signed)
Start Carbidopa Levodopa as follows:  Take 1/2 tablet three times daily, at least 30 minutes before meals (approximately 7am/11am/4pm), for one week  Then take 1/2 tablet in the morning, 1/2 tablet in the afternoon, 1 tablet in the evening, at least 30 minutes before meals, for one week  Then take 1/2 tablet in the morning, 1 tablet in the afternoon, 1 tablet in the evening, at least 30 minutes before meals, for one week  Then take 1 tablet three times daily at 7am/11am/4pm, at least 30 minutes before meals   As a reminder, carbidopa/levodopa can be taken at the same time as a carbohydrate, but we like to have you take your pill either 30 minutes before a protein source or 1 hour after as protein can interfere with carbidopa/levodopa absorption.  Online Resources for Power over Parkinson's Group March 2022  . Local [place] Online Groups  o Power over [employer] Group :   - Power Over Parkinson's Patient Education Group will be Wednesday, March 9th at 2pm via Zoom.   - Upcoming Power over Parkinson's Meetings:  2nd Wednesdays of the month at 2 pm:       April 13th, May 11th - Contact Amy Marriott at [email or web address] if interested in participating in this online group o Parkinson's Care Partners Group:    3rd Mondays, Contact Corwin Levins o Atypical Parkinsonian Patient Group:   4th Wednesdays, Contact Corwin Levins o If you are interested in participating in these online groups with Judson Roch, please contact her directly for how to join those meetings.  Her contact information is [email or web address].  She will send you a link to join the [employer].  (Please note that Corwin Levins , MSW, LCSW, has resigned her position at [facility], but will continue to lead the online groups temporarily)  . [place]:  [email or web address] o PD Health at Home continues:  Mindfulness Mondays, Expert Briefing Tuesdays, Wellness Wednesdays, Take Time Thursdays, Fitness  Fridays -Listings for March 2022 are on the website o Upcoming Webinar:  Conversations about Complementary Therapies and PD.  Wednesday, March 2nd @ 1 pm o Upcoming Webinar:  Can We Put the Brakes on PD Progression?  Wednesday, April 6th @ 1 pm o Geneticist, molecular) at [email or web address] o  Please check out their website to sign up for emails and see their full online offerings  . [place]:  [email or web address]  o Upcoming Webinar:   Trouble Sleeping?  What to Know about Acting out Dreams and other Sleep Issues.  Thursday, March 17th @ 12 noon o Check out additional information on their website to see their full online offerings  . [place]:  [email or web address] o Upcoming Webinar:  Women and Parkinson's.  Tuesday, March 8th at 2 pm o Care Partner Monthly Meetup.  With 05-26-1993 [place].  First Tuesday of each month, 2 pm o Check out additional information to Live Well Today on their website  . Parkinson and Movement Disorders (PMD) Alliance:  www.pmdalliance.org o [employer] Online:  Online Education Events o Sign up for emails, which are sent weekly to give you updates on programming and online offerings   . Parkinson's Association of [place]:  www.[email or web address] o Information on online support groups, education events, and online exercises including Yoga, Parkinson's exercises and more-LOTS of information on links to PD resources and online events o Virtual Support Group through Parkinson's Association of [place]; next one is scheduled for Wednesday, October 05, 2020 at 2 pm. (These  are typically scheduled for the 1st Wednesday of the month at 2 pm).  Visit website for details.  . Additional links for movement activities: o PWR! Moves Classes at [place] RESUMED!  Wednesdays 10 and 11 am.  Contact Amy Marriott, PT [email or web address] or [phone] if  interested o Here is a link to the PWR!Moves classes on Zoom from [place] - Daily Mon-Sat at 10:00. Via Zoom, FREE and open to all.  There is also a link below via Facebook if you use that platform. - [email or web address].si - [email or web address]://[email or web address].no o Parkinson's Wellness Recovery (PWR! Moves)  www.[email or web address] - Info on the PWR! Virtual Experience:  You will have access to our expertise through self-assessment, guided plans that start with the PD-specific fundamentals, educational content, tips, Q&A with an expert, and a growing Art therapist of PD-specific pre-recorded and live exercise classes of varying types and intensity - both physical and cognitive! If that is not enough, we offer 1:1 wellness consultations (in-person or virtual) to personalize your PWR! Research scientist (medical).  - Check out the PWR! Move of the month on the [place] [facility] website:  [email or web address] o [employer] Fridays:  - As part of the PD Health @ Home program, this free video series focuses each week on one aspect of fitness designed to support people living with Parkinson's.  These weekly videos highlight the [place] recent fitness guidelines for people with Parkinson's disease. -  [email or web address].dk o Dance for PD website is offering free, live-stream classes throughout the week, as well as links to [employer] of classes:  [email or web address]://[email or web address] o Dance for Parkinson's Class:  [place].  Free offering for people with Parkinson's and care partners; virtual class.  o For more information, contact [phone] or email Ruffin Frederick at  [email or web address] o Virtual dance and Pilates for Parkinson's classes: Click on the Community Tab> Parkinson's Movement Initiative Tab.  To register for classes and for more information, visit www.[email or web address] and click the "community" tab.     o [employer] Parkinson's Cycling Classes  - [facility] [employer]: 1pm on Fridays-Live classes at [facility] at [email or web address] or [phone]) [phone] [employer]: Virtual Classes Mondays and Thursdays (contact [place] at [email or web address] or [phone])   o [place] levels of classes are offered Tuesdays and Thursdays:  10:30 am,  12 noon & 1:45 pm at [facility].  - Active Stretching with [facility] Class starting in March, on Fridays - To observe a class or for  more information, call [phone] or email [email or web address] . [employer]: o [phone] Opportunities:  www.well-springsolutions.org/caregiver-education/caregiver-support-group.  You may also contact Chief Technology Officer at [email or web address] or [phone].   o Virtual Caregiver Retreat, Monday, February 21st, 3-5 pm o Powerful Tools for Caregivers, 6-wk series for caregivers, in-person, Thursdays March 10, 17, 24, 31 and April 7, 14, from 10:30-12:30 at 03-25-1995, [place].  Contact [employer] (see above) to register o Well-Spring Navigator:  Just1Navigator program, a free service to help individuals and families through the journey of determining care for older adults.  The "Navigator" is a [address], Vickki Muff, who will speak with a prospective client and/or loved ones to provide an assessment of the situation and a set of recommendations for a personalized care plan - all free of charge, and whether [facility] [employer] offers the needed service or not. If the need is not a service we provide, we are  well-connected with  reputable programs in town that we can refer you to.  www.well-springsolutions.org or to speak with the Navigator, call [phone].

## 2020-09-21 DIAGNOSIS — R251 Tremor, unspecified: Secondary | ICD-10-CM | POA: Diagnosis not present

## 2020-09-21 DIAGNOSIS — M6281 Muscle weakness (generalized): Secondary | ICD-10-CM | POA: Diagnosis not present

## 2020-09-21 DIAGNOSIS — Z9181 History of falling: Secondary | ICD-10-CM | POA: Diagnosis not present

## 2020-09-21 DIAGNOSIS — R2681 Unsteadiness on feet: Secondary | ICD-10-CM | POA: Diagnosis not present

## 2020-09-21 DIAGNOSIS — R262 Difficulty in walking, not elsewhere classified: Secondary | ICD-10-CM | POA: Diagnosis not present

## 2020-09-22 DIAGNOSIS — Z9181 History of falling: Secondary | ICD-10-CM | POA: Diagnosis not present

## 2020-09-22 DIAGNOSIS — M6281 Muscle weakness (generalized): Secondary | ICD-10-CM | POA: Diagnosis not present

## 2020-09-22 DIAGNOSIS — R251 Tremor, unspecified: Secondary | ICD-10-CM | POA: Diagnosis not present

## 2020-09-22 DIAGNOSIS — R262 Difficulty in walking, not elsewhere classified: Secondary | ICD-10-CM | POA: Diagnosis not present

## 2020-09-22 DIAGNOSIS — R2681 Unsteadiness on feet: Secondary | ICD-10-CM | POA: Diagnosis not present

## 2020-09-26 DIAGNOSIS — R251 Tremor, unspecified: Secondary | ICD-10-CM | POA: Diagnosis not present

## 2020-09-26 DIAGNOSIS — R2681 Unsteadiness on feet: Secondary | ICD-10-CM | POA: Diagnosis not present

## 2020-09-26 DIAGNOSIS — Z9181 History of falling: Secondary | ICD-10-CM | POA: Diagnosis not present

## 2020-09-26 DIAGNOSIS — M6281 Muscle weakness (generalized): Secondary | ICD-10-CM | POA: Diagnosis not present

## 2020-09-26 DIAGNOSIS — R262 Difficulty in walking, not elsewhere classified: Secondary | ICD-10-CM | POA: Diagnosis not present

## 2020-09-27 IMAGING — US US AORTA
1 series · 12 of 12 positions shown · non-contrast
Comparison: 05/16/2015

CLINICAL DATA: Ex-smoker.  Evaluate abdominal aorta.

EXAM:
ULTRASOUND OF ABDOMINAL AORTA
TECHNIQUE: Ultrasound examination of the abdominal aorta and proximal common
iliac arteries was performed to evaluate for aneurysm. Additional
color and Doppler images of the distal aorta were obtained to
document patency.

[Series 1: us aorta · 0.23mm/px · 12 of 12 slices shown]
[im 1/12]
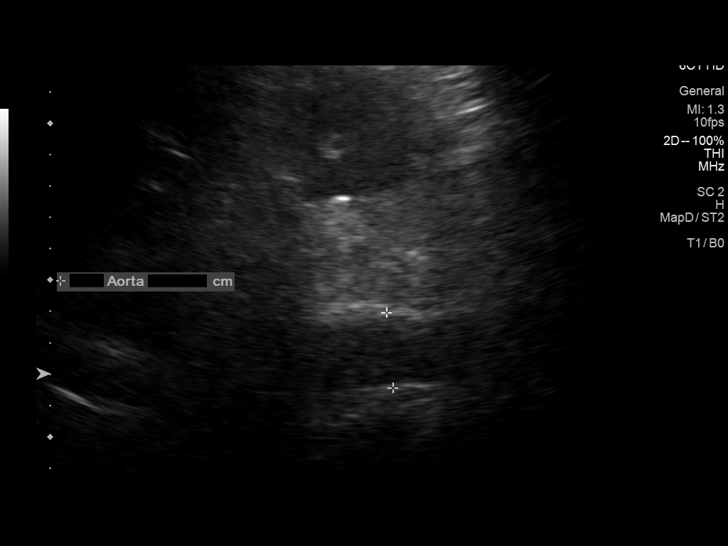
[im 2/12]
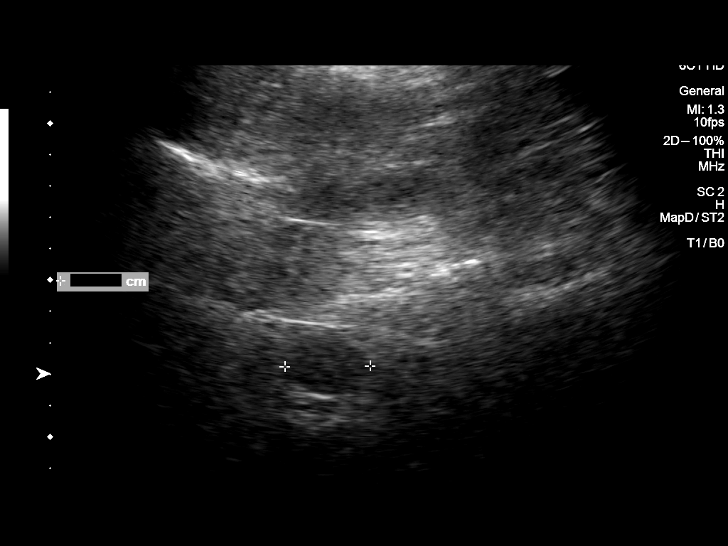
[im 3/12]
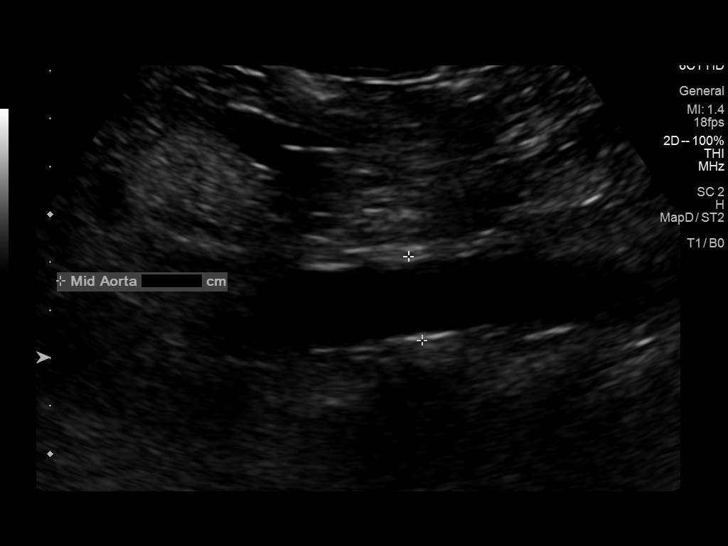
[im 4/12]
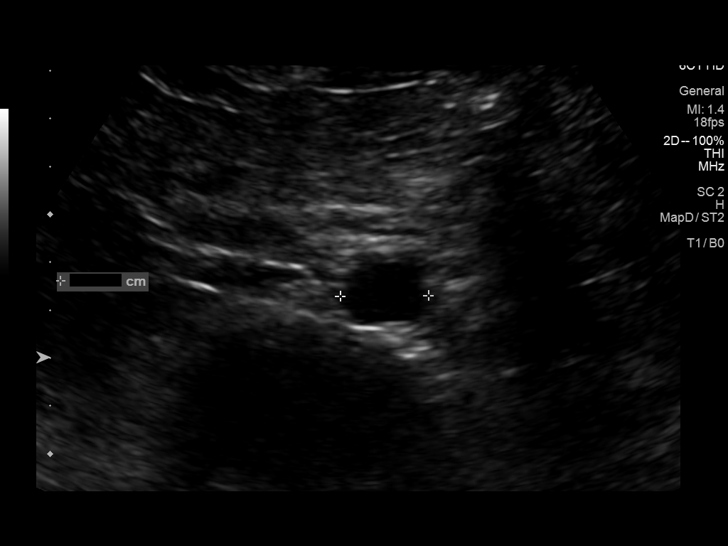
[im 5/12]
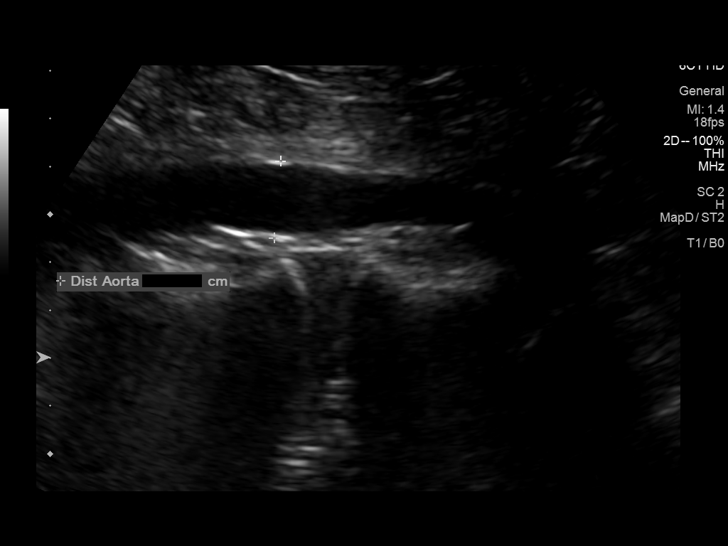
[im 6/12]
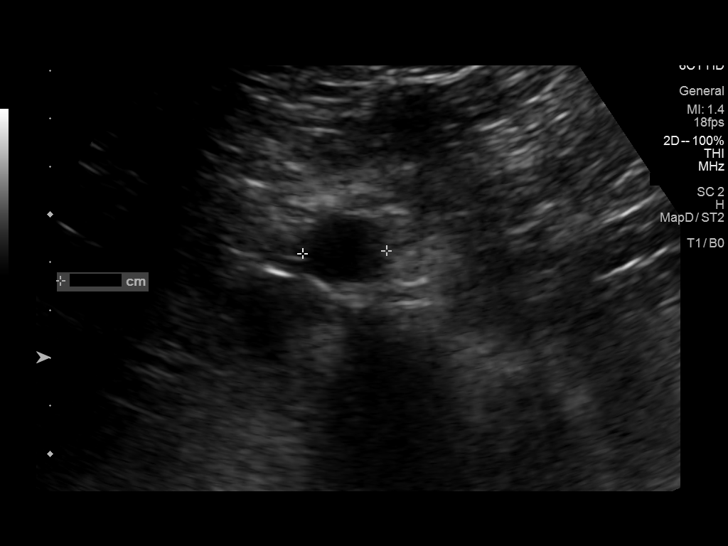
[im 7/12]
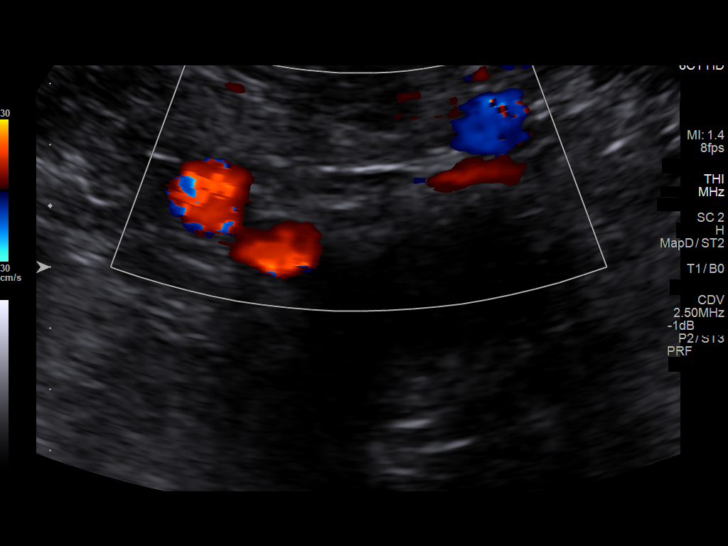
[im 8/12]
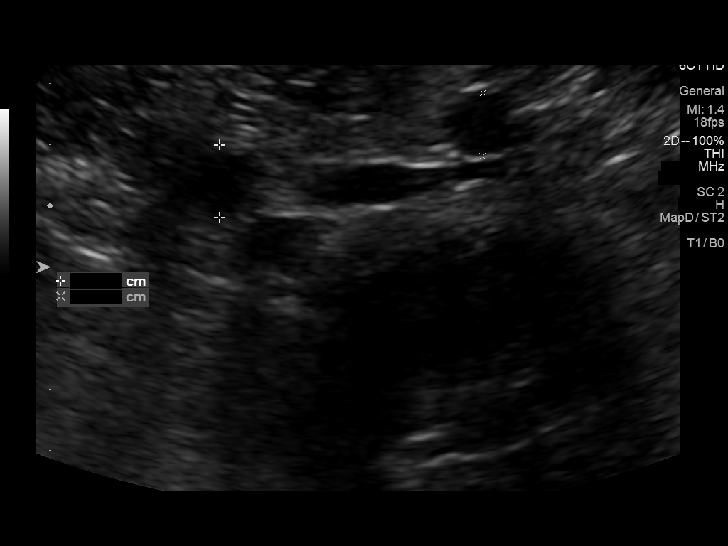
[im 9/12]
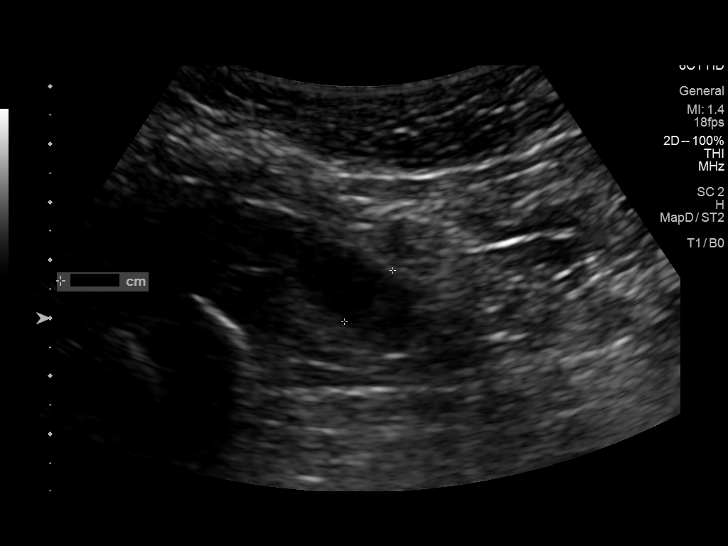
[im 10/12]
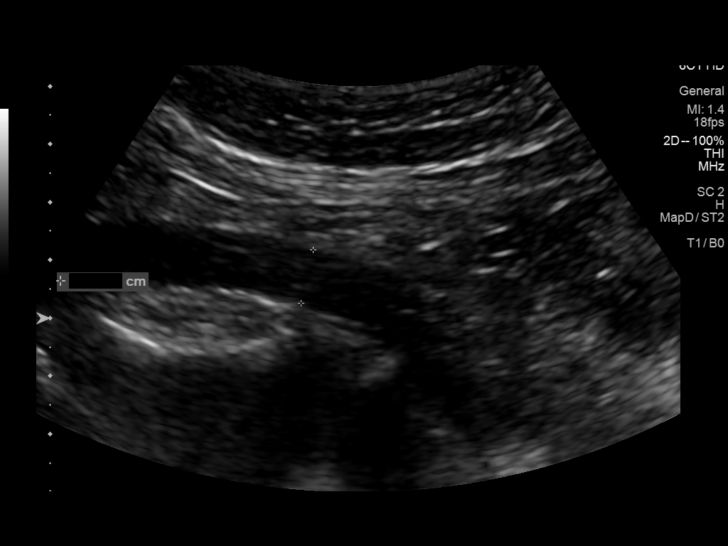
[im 11/12]
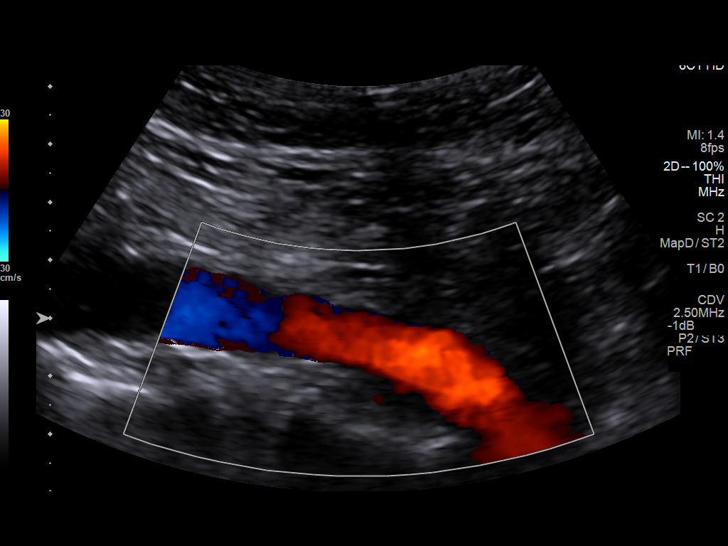
[im 12/12]
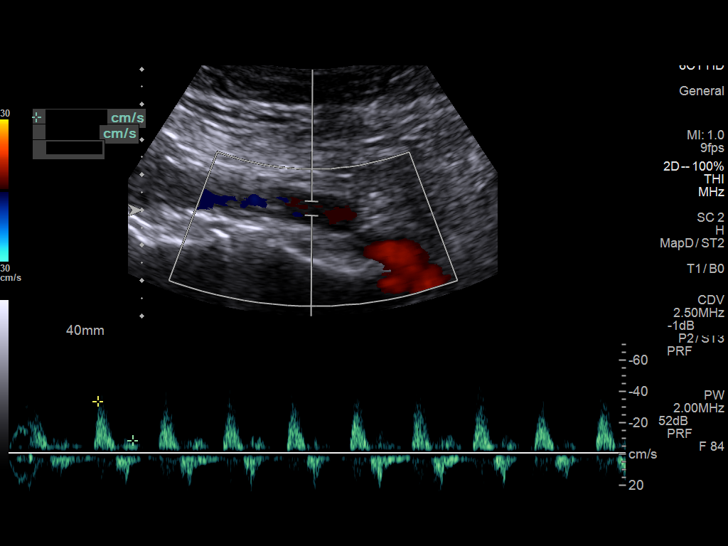

[12 of 12 positions shown; findings below may reference images not displayed]

FINDINGS: Abdominal aortic measurements as follows:

Proximal:  2.4 cm

Mid:  1.8 cm

Distal:  1.6 cm
Patent: Yes, peak systolic velocity is 33.6 cm/s

Right common iliac artery: 1.2 cm

Left common iliac artery: 0.9 cm
IMPRESSION: No evidence of abdominal aortic aneurysm.

## 2020-09-28 DIAGNOSIS — R262 Difficulty in walking, not elsewhere classified: Secondary | ICD-10-CM | POA: Diagnosis not present

## 2020-09-28 DIAGNOSIS — Z9181 History of falling: Secondary | ICD-10-CM | POA: Diagnosis not present

## 2020-09-28 DIAGNOSIS — R2681 Unsteadiness on feet: Secondary | ICD-10-CM | POA: Diagnosis not present

## 2020-09-28 DIAGNOSIS — M6281 Muscle weakness (generalized): Secondary | ICD-10-CM | POA: Diagnosis not present

## 2020-09-28 DIAGNOSIS — R251 Tremor, unspecified: Secondary | ICD-10-CM | POA: Diagnosis not present

## 2020-10-05 DIAGNOSIS — Z9181 History of falling: Secondary | ICD-10-CM | POA: Diagnosis not present

## 2020-10-05 DIAGNOSIS — R2681 Unsteadiness on feet: Secondary | ICD-10-CM | POA: Diagnosis not present

## 2020-10-05 DIAGNOSIS — R251 Tremor, unspecified: Secondary | ICD-10-CM | POA: Diagnosis not present

## 2020-10-05 DIAGNOSIS — M6281 Muscle weakness (generalized): Secondary | ICD-10-CM | POA: Diagnosis not present

## 2020-10-05 DIAGNOSIS — R262 Difficulty in walking, not elsewhere classified: Secondary | ICD-10-CM | POA: Diagnosis not present

## 2020-10-06 DIAGNOSIS — Z9181 History of falling: Secondary | ICD-10-CM | POA: Diagnosis not present

## 2020-10-06 DIAGNOSIS — M6281 Muscle weakness (generalized): Secondary | ICD-10-CM | POA: Diagnosis not present

## 2020-10-06 DIAGNOSIS — R262 Difficulty in walking, not elsewhere classified: Secondary | ICD-10-CM | POA: Diagnosis not present

## 2020-10-06 DIAGNOSIS — R2681 Unsteadiness on feet: Secondary | ICD-10-CM | POA: Diagnosis not present

## 2020-10-06 DIAGNOSIS — R251 Tremor, unspecified: Secondary | ICD-10-CM | POA: Diagnosis not present

## 2020-10-11 DIAGNOSIS — M6281 Muscle weakness (generalized): Secondary | ICD-10-CM | POA: Diagnosis not present

## 2020-10-11 DIAGNOSIS — R2681 Unsteadiness on feet: Secondary | ICD-10-CM | POA: Diagnosis not present

## 2020-10-11 DIAGNOSIS — R251 Tremor, unspecified: Secondary | ICD-10-CM | POA: Diagnosis not present

## 2020-10-11 DIAGNOSIS — Z9181 History of falling: Secondary | ICD-10-CM | POA: Diagnosis not present

## 2020-10-11 DIAGNOSIS — R262 Difficulty in walking, not elsewhere classified: Secondary | ICD-10-CM | POA: Diagnosis not present

## 2020-10-12 DIAGNOSIS — R251 Tremor, unspecified: Secondary | ICD-10-CM | POA: Diagnosis not present

## 2020-10-12 DIAGNOSIS — R262 Difficulty in walking, not elsewhere classified: Secondary | ICD-10-CM | POA: Diagnosis not present

## 2020-10-12 DIAGNOSIS — Z9181 History of falling: Secondary | ICD-10-CM | POA: Diagnosis not present

## 2020-10-12 DIAGNOSIS — R2681 Unsteadiness on feet: Secondary | ICD-10-CM | POA: Diagnosis not present

## 2020-10-12 DIAGNOSIS — M6281 Muscle weakness (generalized): Secondary | ICD-10-CM | POA: Diagnosis not present

## 2020-10-17 DIAGNOSIS — R2681 Unsteadiness on feet: Secondary | ICD-10-CM | POA: Diagnosis not present

## 2020-10-17 DIAGNOSIS — R251 Tremor, unspecified: Secondary | ICD-10-CM | POA: Diagnosis not present

## 2020-10-17 DIAGNOSIS — R262 Difficulty in walking, not elsewhere classified: Secondary | ICD-10-CM | POA: Diagnosis not present

## 2020-10-17 DIAGNOSIS — M6281 Muscle weakness (generalized): Secondary | ICD-10-CM | POA: Diagnosis not present

## 2020-10-17 DIAGNOSIS — Z9181 History of falling: Secondary | ICD-10-CM | POA: Diagnosis not present

## 2020-10-20 DIAGNOSIS — R262 Difficulty in walking, not elsewhere classified: Secondary | ICD-10-CM | POA: Diagnosis not present

## 2020-10-20 DIAGNOSIS — Z9181 History of falling: Secondary | ICD-10-CM | POA: Diagnosis not present

## 2020-10-20 DIAGNOSIS — R251 Tremor, unspecified: Secondary | ICD-10-CM | POA: Diagnosis not present

## 2020-10-20 DIAGNOSIS — M6281 Muscle weakness (generalized): Secondary | ICD-10-CM | POA: Diagnosis not present

## 2020-10-20 DIAGNOSIS — R2681 Unsteadiness on feet: Secondary | ICD-10-CM | POA: Diagnosis not present

## 2020-10-24 DIAGNOSIS — M6281 Muscle weakness (generalized): Secondary | ICD-10-CM | POA: Diagnosis not present

## 2020-10-24 DIAGNOSIS — R2681 Unsteadiness on feet: Secondary | ICD-10-CM | POA: Diagnosis not present

## 2020-10-24 DIAGNOSIS — R262 Difficulty in walking, not elsewhere classified: Secondary | ICD-10-CM | POA: Diagnosis not present

## 2020-10-24 DIAGNOSIS — R251 Tremor, unspecified: Secondary | ICD-10-CM | POA: Diagnosis not present

## 2020-10-24 DIAGNOSIS — Z9181 History of falling: Secondary | ICD-10-CM | POA: Diagnosis not present

## 2020-10-25 ENCOUNTER — Ambulatory Visit: Payer: PPO | Admitting: Neurology

## 2020-10-26 DIAGNOSIS — Z9181 History of falling: Secondary | ICD-10-CM | POA: Diagnosis not present

## 2020-10-26 DIAGNOSIS — M6281 Muscle weakness (generalized): Secondary | ICD-10-CM | POA: Diagnosis not present

## 2020-10-26 DIAGNOSIS — R262 Difficulty in walking, not elsewhere classified: Secondary | ICD-10-CM | POA: Diagnosis not present

## 2020-10-26 DIAGNOSIS — R251 Tremor, unspecified: Secondary | ICD-10-CM | POA: Diagnosis not present

## 2020-10-26 DIAGNOSIS — R2681 Unsteadiness on feet: Secondary | ICD-10-CM | POA: Diagnosis not present

## 2020-10-31 DIAGNOSIS — M6281 Muscle weakness (generalized): Secondary | ICD-10-CM | POA: Diagnosis not present

## 2020-10-31 DIAGNOSIS — R2681 Unsteadiness on feet: Secondary | ICD-10-CM | POA: Diagnosis not present

## 2020-10-31 DIAGNOSIS — R262 Difficulty in walking, not elsewhere classified: Secondary | ICD-10-CM | POA: Diagnosis not present

## 2020-10-31 IMAGING — MR MR HEAD W/O CM
10 series · 48 of 48 positions shown · non-contrast
Comparison: None.

CLINICAL DATA: Cerebellar ataxia. Idiopathic progressive
neuropathy. Tremor.

EXAM:
MRI HEAD WITHOUT CONTRAST
TECHNIQUE: Multiplanar, multiecho pulse sequences of the brain and surrounding
structures were obtained without intravenous contrast.

[Series 3: t1_se_sag · sagittal · 5.0mm · 0.45mm/px · 1 of 24 slices shown]
[im 1/24]
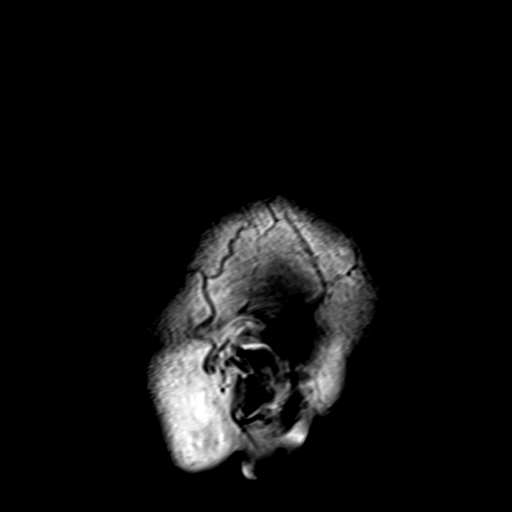

[Series 4: t2_tse_tra_512 · axial · 5.0mm · 0.62mm/px · z∈[-59,+109]mm · 3 of 29 slices shown]
[im 1/29]
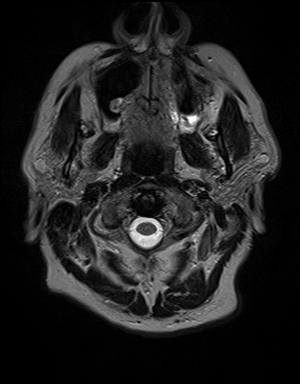
[im 15/29]
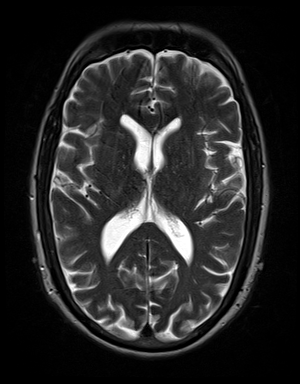
[im 29/29]
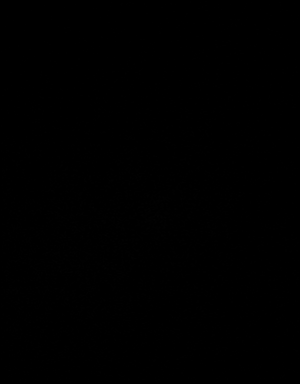

[Series 5: ep2d_diff_ · axial · 3.0mm · 1.88mm/px · z∈[-53,+103]mm · 9 of 101 slices shown]
[im 1/101]
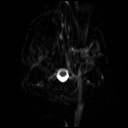
[im 13/101]
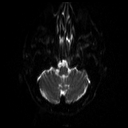
[im 26/101]
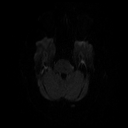
[im 38/101]
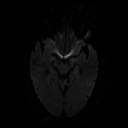
[im 51/101]
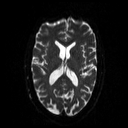
[im 63/101]
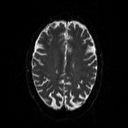
[im 76/101]
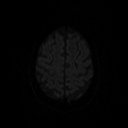
[im 88/101]
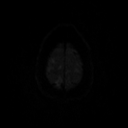
[im 101/101]
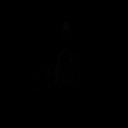

[Series 6: ep2d_diff__adc · axial · 3.0mm · 1.88mm/px · z∈[-53,+103]mm · 5 of 53 slices shown]
[im 1/53]
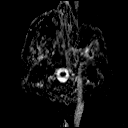
[im 14/53]
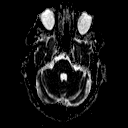
[im 27/53]
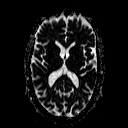
[im 40/53]
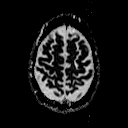
[im 53/53]
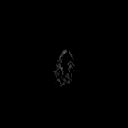

[Series 7: ep2d_diff_cor · coronal · 5.0mm · 1.77mm/px · 5 of 62 slices shown]
[im 1/62]
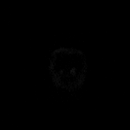
[im 16/62]
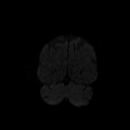
[im 31/62]
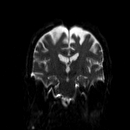
[im 46/62]
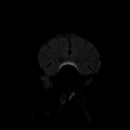
[im 62/62]
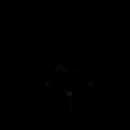

[Series 8: ep2d_diff_cor_adc · coronal · 5.0mm · 1.77mm/px · 3 of 32 slices shown]
[im 1/32]
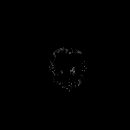
[im 16/32]
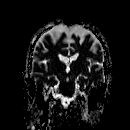
[im 32/32]
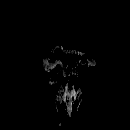

[Series 10: swi_images · axial · 4.0mm · 0.90mm/px · z∈[-53,+102]mm · 4 of 40 slices shown]
[im 1/40]
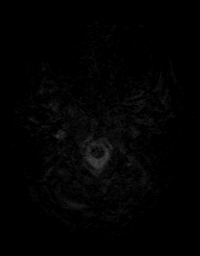
[im 14/40]
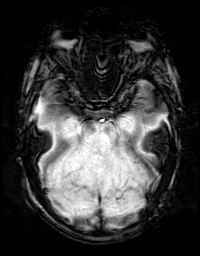
[im 27/40]
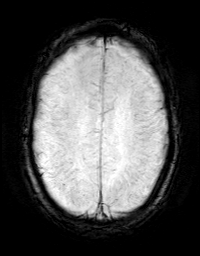
[im 40/40]
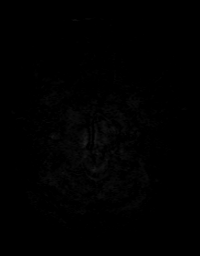

[Series 11: FLAIR · axial · 3.0mm · 0.47mm/px · z∈[-53,+103]mm · 2 of 27 slices shown]
[im 1/27]
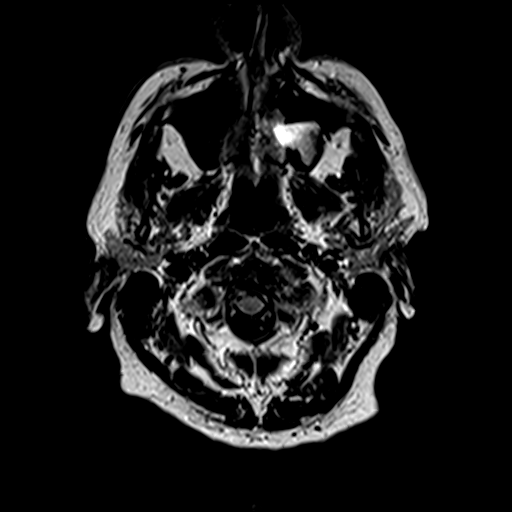
[im 27/27]
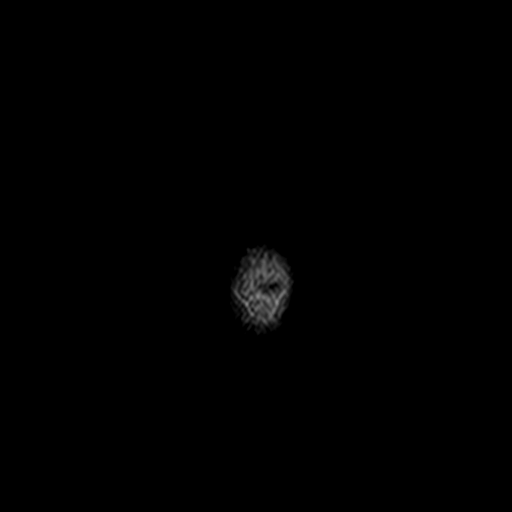

[Series 12: t1_mpr_tra · axial · 1.1mm · 0.75mm/px · z∈[-53,+103]mm · 13 of 144 slices shown]
[im 1/144]
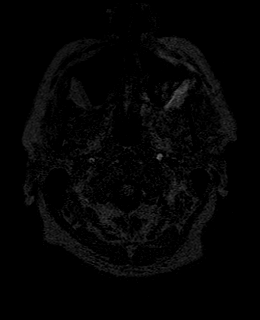
[im 12/144]
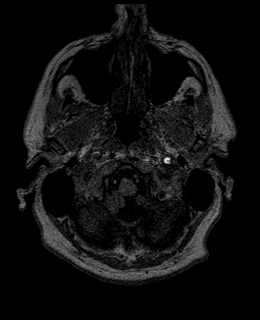
[im 24/144]
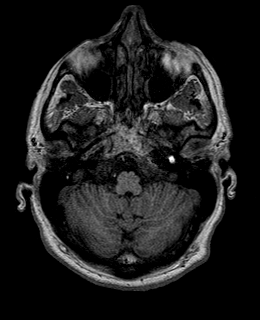
[im 36/144]
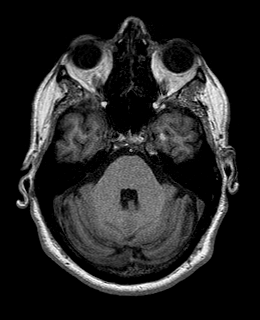
[im 48/144]
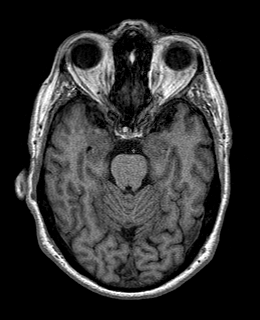
[im 60/144]
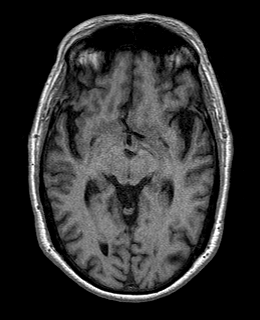
[im 72/144]
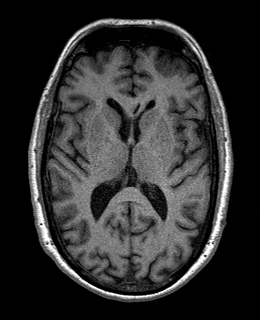
[im 84/144]
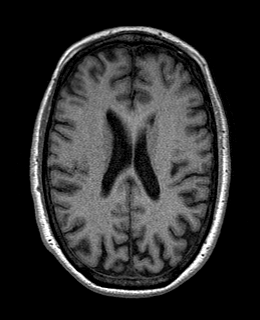
[im 96/144]
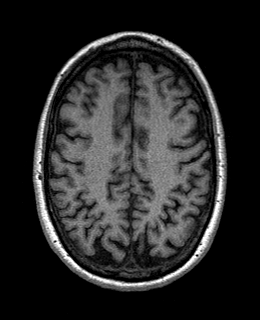
[im 108/144]
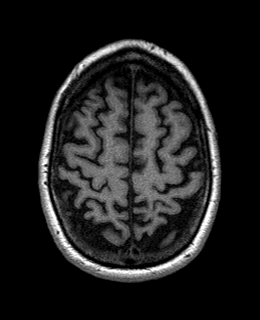
[im 120/144]
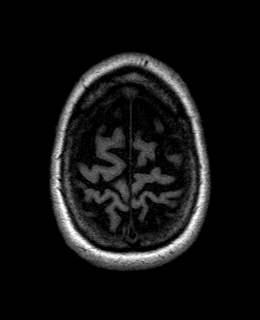
[im 132/144]
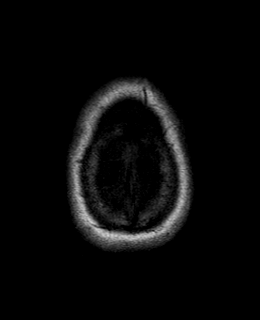
[im 144/144]
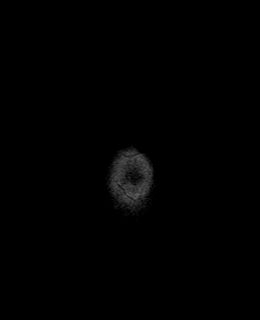

[Series 13: T2 · coronal · 5.0mm · 0.45mm/px · 3 of 31 slices shown]
[im 1/31]
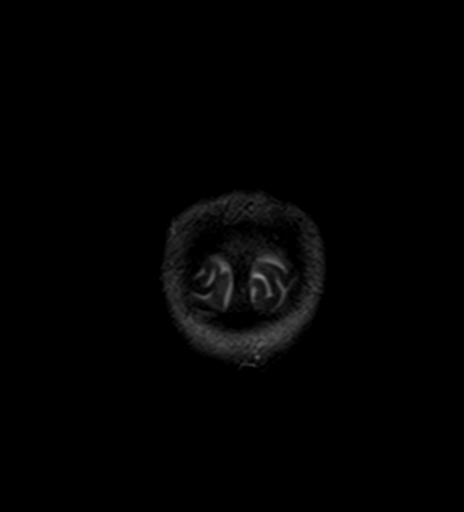
[im 16/31]
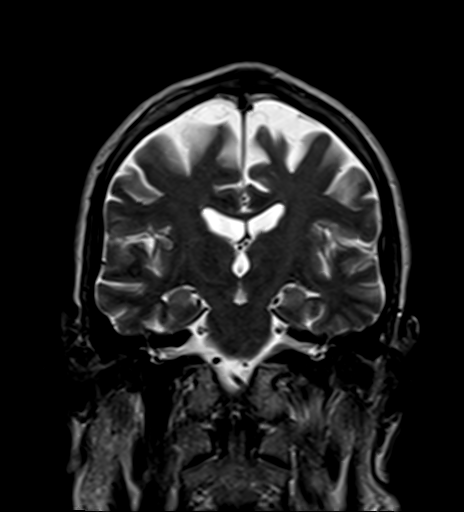
[im 31/31]
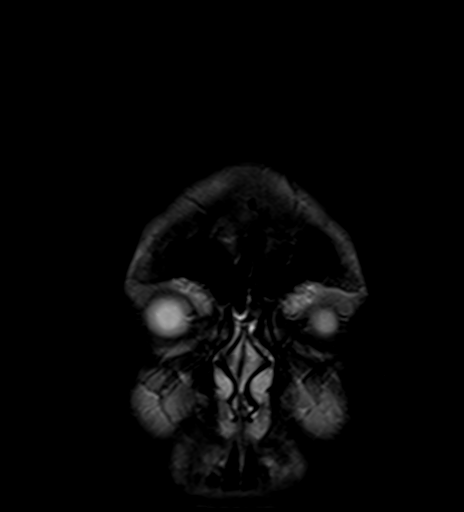

[48 of 48 positions shown; findings below may reference images not displayed]

FINDINGS: Brain: No acute infarction, hemorrhage, hydrocephalus, extra-axial
collection or mass lesion. Mild cortical atrophy.

Vascular: Normal arterial flow voids.

Skull and upper cervical spine: No focal skeletal lesion.

Sinuses/Orbits: Mucosal edema paranasal sinuses. Bilateral cataract
surgery

Other: None
IMPRESSION: No acute intracranial abnormality. Mild cortical atrophy.

## 2020-11-02 DIAGNOSIS — M6281 Muscle weakness (generalized): Secondary | ICD-10-CM | POA: Diagnosis not present

## 2020-11-02 DIAGNOSIS — R262 Difficulty in walking, not elsewhere classified: Secondary | ICD-10-CM | POA: Diagnosis not present

## 2020-11-02 DIAGNOSIS — R2681 Unsteadiness on feet: Secondary | ICD-10-CM | POA: Diagnosis not present

## 2020-11-07 DIAGNOSIS — R262 Difficulty in walking, not elsewhere classified: Secondary | ICD-10-CM | POA: Diagnosis not present

## 2020-11-07 DIAGNOSIS — M6281 Muscle weakness (generalized): Secondary | ICD-10-CM | POA: Diagnosis not present

## 2020-11-07 DIAGNOSIS — R2681 Unsteadiness on feet: Secondary | ICD-10-CM | POA: Diagnosis not present

## 2020-11-09 DIAGNOSIS — R262 Difficulty in walking, not elsewhere classified: Secondary | ICD-10-CM | POA: Diagnosis not present

## 2020-11-09 DIAGNOSIS — R2681 Unsteadiness on feet: Secondary | ICD-10-CM | POA: Diagnosis not present

## 2020-11-09 DIAGNOSIS — M6281 Muscle weakness (generalized): Secondary | ICD-10-CM | POA: Diagnosis not present

## 2020-11-14 DIAGNOSIS — Z8546 Personal history of malignant neoplasm of prostate: Secondary | ICD-10-CM | POA: Diagnosis not present

## 2020-11-15 DIAGNOSIS — M6281 Muscle weakness (generalized): Secondary | ICD-10-CM | POA: Diagnosis not present

## 2020-11-15 DIAGNOSIS — R262 Difficulty in walking, not elsewhere classified: Secondary | ICD-10-CM | POA: Diagnosis not present

## 2020-11-15 DIAGNOSIS — R2681 Unsteadiness on feet: Secondary | ICD-10-CM | POA: Diagnosis not present

## 2020-11-16 DIAGNOSIS — M6281 Muscle weakness (generalized): Secondary | ICD-10-CM | POA: Diagnosis not present

## 2020-11-16 DIAGNOSIS — R262 Difficulty in walking, not elsewhere classified: Secondary | ICD-10-CM | POA: Diagnosis not present

## 2020-11-16 DIAGNOSIS — R2681 Unsteadiness on feet: Secondary | ICD-10-CM | POA: Diagnosis not present

## 2020-11-21 DIAGNOSIS — R3915 Urgency of urination: Secondary | ICD-10-CM | POA: Diagnosis not present

## 2020-11-21 DIAGNOSIS — Z8546 Personal history of malignant neoplasm of prostate: Secondary | ICD-10-CM | POA: Diagnosis not present

## 2020-11-21 DIAGNOSIS — R351 Nocturia: Secondary | ICD-10-CM | POA: Diagnosis not present

## 2020-11-21 DIAGNOSIS — G20 Parkinson's disease: Secondary | ICD-10-CM | POA: Diagnosis not present

## 2020-11-22 ENCOUNTER — Telehealth: Payer: Self-pay

## 2020-11-22 DIAGNOSIS — E785 Hyperlipidemia, unspecified: Secondary | ICD-10-CM

## 2020-11-22 DIAGNOSIS — R262 Difficulty in walking, not elsewhere classified: Secondary | ICD-10-CM | POA: Diagnosis not present

## 2020-11-22 DIAGNOSIS — M6281 Muscle weakness (generalized): Secondary | ICD-10-CM | POA: Diagnosis not present

## 2020-11-22 DIAGNOSIS — R2681 Unsteadiness on feet: Secondary | ICD-10-CM | POA: Diagnosis not present

## 2020-11-22 MED ORDER — ROSUVASTATIN CALCIUM 10 MG PO TABS: 10.0000 mg | ORAL_TABLET | Freq: Every day | ORAL | 3 refills | Status: AC

## 2020-11-22 NOTE — Telephone Encounter (Signed)
Pt. Called wanting to know if you could refill his rosuvastatin and a prescription for toe nail fungus starts with a t he said. He stated he needs them sent in to [facility] in [place]. Pt. Last apt was 06/28/20.

## 2020-11-22 NOTE — Telephone Encounter (Signed)
I called pt. To get him scheduled. He said his toe nails look fine now but would call back at a later time to schedule an apt. He just knows you need to stay on top of the fungus so it doesn't get bad.

## 2020-11-22 NOTE — Telephone Encounter (Signed)
I would need to see him before I can renew the Lamisil

## 2020-11-23 DIAGNOSIS — M6281 Muscle weakness (generalized): Secondary | ICD-10-CM | POA: Diagnosis not present

## 2020-11-23 DIAGNOSIS — R262 Difficulty in walking, not elsewhere classified: Secondary | ICD-10-CM | POA: Diagnosis not present

## 2020-11-23 DIAGNOSIS — R2681 Unsteadiness on feet: Secondary | ICD-10-CM | POA: Diagnosis not present

## 2020-11-25 ENCOUNTER — Ambulatory Visit: Payer: PPO | Admitting: Neurology

## 2020-11-30 DIAGNOSIS — R2681 Unsteadiness on feet: Secondary | ICD-10-CM | POA: Diagnosis not present

## 2020-11-30 DIAGNOSIS — M6281 Muscle weakness (generalized): Secondary | ICD-10-CM | POA: Diagnosis not present

## 2020-11-30 DIAGNOSIS — R262 Difficulty in walking, not elsewhere classified: Secondary | ICD-10-CM | POA: Diagnosis not present

## 2020-12-02 DIAGNOSIS — R262 Difficulty in walking, not elsewhere classified: Secondary | ICD-10-CM | POA: Diagnosis not present

## 2020-12-02 DIAGNOSIS — R2681 Unsteadiness on feet: Secondary | ICD-10-CM | POA: Diagnosis not present

## 2020-12-02 DIAGNOSIS — M6281 Muscle weakness (generalized): Secondary | ICD-10-CM | POA: Diagnosis not present

## 2020-12-07 DIAGNOSIS — R2681 Unsteadiness on feet: Secondary | ICD-10-CM | POA: Diagnosis not present

## 2020-12-07 DIAGNOSIS — R251 Tremor, unspecified: Secondary | ICD-10-CM | POA: Diagnosis not present

## 2020-12-07 DIAGNOSIS — Z9181 History of falling: Secondary | ICD-10-CM | POA: Diagnosis not present

## 2020-12-09 DIAGNOSIS — Z9181 History of falling: Secondary | ICD-10-CM | POA: Diagnosis not present

## 2020-12-09 DIAGNOSIS — R251 Tremor, unspecified: Secondary | ICD-10-CM | POA: Diagnosis not present

## 2020-12-09 DIAGNOSIS — R2681 Unsteadiness on feet: Secondary | ICD-10-CM | POA: Diagnosis not present

## 2020-12-22 ENCOUNTER — Telehealth: Payer: Self-pay | Admitting: Neurology

## 2020-12-22 NOTE — Telephone Encounter (Signed)
Patient added to wait list for potential openings sooner.

## 2020-12-23 NOTE — Telephone Encounter (Signed)
Called patient and was unable to leave a voicemail as call was dropped once voicemail came on.   Need to inform patient that the most we can do is add him to a waitlist. This is not an Urgent request for an appointment and patient will have to wait until his f/u appt.

## 2020-12-26 NOTE — Telephone Encounter (Signed)
Pt called in and is requesting his meds be upped if possible. He doesn't think the strength he has is working.  Carbidopa-levodopa [phone]

## 2020-12-27 MED ORDER — CARBIDOPA-LEVODOPA 25-100 MG PO TABS: ORAL_TABLET | ORAL | 1 refills | Status: DC

## 2020-12-27 NOTE — Telephone Encounter (Addendum)
He can try 2 in the AM, 2 in the afternoon, 1 in the evening and will address more when I see him soon.  I sent that to the pharmacy

## 2020-12-28 NOTE — Telephone Encounter (Signed)
Called patient and informed him that per Dr. Carles Collet He can try 2 in the AM, 2 in the afternoon, 1 in the evening and Dr. Carles Collet will address more when she see's him soon.  Informed patient that the rx was sent to the pharmacy. Patient verbalized understanding and had no further questions or concerns.

## 2021-01-06 ENCOUNTER — Encounter: Payer: Self-pay | Admitting: Neurology

## 2021-01-23 ENCOUNTER — Telehealth: Payer: Self-pay | Admitting: Internal Medicine

## 2021-01-23 MED ORDER — HYDROCORTISONE ACETATE 25 MG RE SUPP: 25.0000 mg | Freq: Every day | RECTAL | 2 refills | Status: AC

## 2021-01-23 NOTE — Telephone Encounter (Signed)
Spoke with pt and he is aware. Script sent to pharmacy and pt knows to call back if the bleeding does not stop.

## 2021-01-23 NOTE — Telephone Encounter (Signed)
Pt states he has been taking 2 rounded tbsp of metamucil for some "leakage" he was having. The other day he strained to have a BM and now when he wipes he is seeing BRB on the toilet tissue. Pt has not taken the metamucil for the past 2 days and wants to know what Dr. Henrene Pastor recommends. He feels he has hemorrhoids. Please advise.

## 2021-01-23 NOTE — Telephone Encounter (Signed)
1.  It may be hemorrhoids.  If so, prescribed Anusol HC suppositories (or the equivalent).  1 per rectum at night for 2 weeks.  Dispense 14.  2 refills 2.  Continue Metamucil daily 3.  If bleeding persist despite these measures, let us know. 4.  Note.  He does have a history of prostate cancer with radioactive seed implantation.  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  Note: He has history of adenomatous colon polyps.  Routine surveillance would be due May 2023.  We could always move this up sooner if needed.

## 2021-01-23 NOTE — Telephone Encounter (Signed)
Inbound call from pt requesting a call back stating that when makes a BM and wipe he notices bright red blood. Please advise. Thanks.

## 2021-01-25 NOTE — Progress Notes (Signed)
Assessment/Plan:   1.  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  -EMG unrevealing.  -The right foot drop and right lower extremity paresthesias may be due to prior surgery  -Discussed end-of-life issues.    -Discussed the importance of advanced directives.  -Patient and wife asked multiple questions and I answered those to the best of my ability.  Strongly recommended counseling for both patient and wife, both together as well as independently.  Patient's wife admitted that she was angry about the loss of what she saw as her retirement and her life.  Recommended counseling for her.  Recommended that she attend the atypical support group.  Told her that we are having a former wife/caregiver come in and speak and recommend she connect with her.  I did make a referral to our LCSW.  -Discussed with patient and wife that often times levodopa does not work in this disease.  We usually try to push it up to close to 1000 mg daily, as long as no side effects, to see if we will get any benefit.  If not, we will just stop it.  Currently, patient without side effects.  We will slowly increase the carbidopa/levodopa 25/100 so that he takes 3 tablets in the morning, 3 in the afternoon and 2 in the evening.  He will let me know if he has any side effects.  -He will use the walker at all times.   Subjective:   Bernard Reed was seen today in follow up for parkinsonism.  My previous records as well as any outside records available were reviewed prior to todays visit.  This patient is accompanied in the office by his spouse who supplements the history.  Patient started on levodopa last visit for probable atypical state.  Discussed that levodopa may not always be helpful.  He called at the end of June stating that levodopa was not helping him.  This was not particularly surprising, but ultimately we ended up increasing  his levodopa until today's visit.  He hasn't had a fall in 4 weeks.  Attributes that to the use of the rollator.  Prior to that, he was previously using the cane.  Swallowing is "great."  Wife thinks that he is not shaking as bad.  Vision is good - no double.  R leg will drag on him after ambulating for a bit.    Current movement disorder medications: Carbidopa/levodopa 25/100: 2/2/1 (increased at the end of June at patient's request, as stated that it was previously not working)   PREVIOUS MEDICATIONS: none to date  CURRENT MEDICATIONS:  Outpatient Encounter Medications as of 01/26/2021  Medication Sig   aspirin 81 MG tablet Take 81 mg by mouth daily.   carbidopa-levodopa (SINEMET IR) 25-100 MG tablet 2 at 7am/2 at 11am/1 at 4pm   diclofenac (VOLTAREN) 75 MG EC tablet Take 75 mg by mouth 2 (two) times daily.   hydrocortisone (ANUSOL-HC) 25 MG suppository Place 1 suppository (25 mg total) rectally at bedtime.   rosuvastatin (CRESTOR) 10 MG tablet Take 1 tablet (10 mg total) by mouth daily.   tamsulosin (FLOMAX) 0.4 MG CAPS capsule Take 1 capsule (0.4 mg total) by mouth daily.   No facility-administered encounter medications on file as of 01/26/2021.     Objective:   PHYSICAL EXAMINATION:    VITALS:   Vitals:   01/26/21 1513  BP: 140/70  Pulse: 70  SpO2: 97%  Weight: 184 lb 6.4 oz (83.6 kg)  Height: '5\' 9"'$  (1.753 m)     GEN:  The patient appears stated age and is in NAD. HEENT:  Normocephalic, atraumatic.  The mucous membranes are moist. The superficial temporal arteries are without ropiness or tenderness. CV:  RRR Lungs:  CTAB Neck/HEME:  There are no carotid bruits bilaterally.  Neurological examination:  Orientation: The patient is alert and oriented x3. Cranial nerves: There is good facial symmetry.the extraocular muscles are intact.  The speech is fluent and clear.  There is no pseudobulbar speech.  Soft palate rises symmetrically and there is no tongue deviation.  Hearing is intact to conversational tone. Sensation: Sensation is intact to light touch throughout Motor: Strength is at least antigravity x4.  Movement examination: Tone: There is mild increased tone in the RUE Abnormal movements: none (tremor felt and not seen) Coordination:  There is decremation on the right-hand side.  He has trouble with foot taps on the right (some of that is due to foot drop) Gait and Station: Patient pushes off of the chair to arise.  Walks fairly well with the walker.  Without the walker, he is very wide-based and somewhat unsteady.  Turns en bloc.   Total time spent on today's visit was 62 minutes, including both face-to-face time and nonface-to-face time.  Time included that spent on review of records (prior notes available to me/labs/imaging if pertinent), discussing treatment and goals, answering patient's questions and coordinating care.  Cc:  Denita Lung, MD

## 2021-01-26 ENCOUNTER — Encounter: Payer: Self-pay | Admitting: Neurology

## 2021-01-26 ENCOUNTER — Other Ambulatory Visit: Payer: Self-pay

## 2021-01-26 ENCOUNTER — Ambulatory Visit: Payer: PPO | Admitting: Neurology

## 2021-01-26 VITALS — BP 140/70 | HR 70 | Ht 69.0 in | Wt 184.4 lb

## 2021-01-26 DIAGNOSIS — G231 Progressive supranuclear ophthalmoplegia [Steele-Richardson-Olszewski]: Secondary | ICD-10-CM

## 2021-01-26 MED ORDER — CARBIDOPA-LEVODOPA 25-100 MG PO TABS: ORAL_TABLET | ORAL | 1 refills | Status: AC

## 2021-01-26 NOTE — Patient Instructions (Signed)
Week 1: Take carbidopa/levodopa 25/100, 2 tablets three times per day  Week 2: Take carbidopa/levodopa 25/100, 3 in the AM, 2 in the afternoon, 2 in the evening  Week 3:  Take carbidopa/levodopa 25/100, 3 in the AM, 3 in the afternoon, 2 in the evening

## 2021-02-06 ENCOUNTER — Institutional Professional Consult (permissible substitution): Payer: PPO | Admitting: Licensed Clinical Social Worker

## 2021-02-07 ENCOUNTER — Other Ambulatory Visit: Payer: Self-pay

## 2021-02-07 ENCOUNTER — Ambulatory Visit: Payer: PPO | Admitting: Licensed Clinical Social Worker

## 2021-02-07 DIAGNOSIS — F4321 Adjustment disorder with depressed mood: Secondary | ICD-10-CM

## 2021-02-08 NOTE — BH Specialist Note (Signed)
Integrated Behavioral Health Initial In-Person Visit  MRN: [id number] Name: Bernard Reed  Number of [facility] Clinician visits:: 1/6 Session Start time: 2:00  Session End time: 3:00 Total time: 60 minutes  Types of Service: Family psychotherapy and [place] [facility] ([facility])  Interpretor:No. Interpretor Name and Language: NA   Warm Hand Off Completed.         Subjective: Bernard Reed is a 75 y.o. male accompanied by Spouse Patient was referred by Dr.  Wells Guiles Tat for Life role transition and caregiver coping  distress Patient reports the following symptoms/concerns: Feelings of anger towards spouse, inability to enjoy the aspects of caregiver Pt coming to terms to transition of physical decline and new role  Duration of problem several months; Severity of problem: moderate  Objective: Mood: Irritable and Affect: Appropriate Risk of harm to self or others: No plan to harm self or others  Life Context: Family and Social: lives with wife School/Work: Retired  Self-Care: Requires some assistance with ADL's  Life Changes: Atypical PSP  Patient and/or Family's Strengths/Protective Factors: Social connections  Goals Addressed: Patient will: Reduce symptoms of: agitation, mood instability, and stress Increase knowledge and/or ability of: coping skills and stress reduction  Demonstrate ability to: Increase healthy adjustment to current life circumstances  Progress towards Goals: Ongoing  Interventions: Interventions utilized: Solution-Focused Strategies, CBT Cognitive Behavioral Therapy, Psychoeducation and/or Health Education, and Communication Skills  Standardized Assessments completed: Not Needed  Patient and/or Family Response: Pt and caregiver open to plan and long term goals of gaining further knowledge of of pt's disease maximize support system resolve conflict and increase communication and develop new sense of purpose  Patient  Centered Plan: Patient is on the following Treatment Plan(s):  Pt and caregiver open to plan and long term goals of gaining further knowledge of of pt's disease maximize support system resolve conflict and increase communication and develop new sense of purpose. Meet with LCSW in three weeks  and other therapeutic interventions.   Assessment: Patient currently experiencing Feelings of despair, loss of purpose, caregiver feeling overwhelmed and anger and irritability towards spouse  .   Patient may benefit from Caregiver have individual counseling further LCSW counseling and maximize support system and conflict resolution .  Plan: Follow up with behavioral health clinician on : in or about three weeks  Behavioral recommendations: Caregiver to look at respite options, support groups individual counseling options .  Discussed weekly check in and with spouse.  Pt to develop new coping skills for new role in life Referral(s): Counselor "From scale of 1-10, how likely are you to follow plan?": 9  Emree Locicero A Taylor-Paladino, LCSW

## 2021-03-01 ENCOUNTER — Ambulatory Visit: Payer: PPO | Admitting: Neurology

## 2021-03-08 NOTE — Progress Notes (Signed)
Documentation for virtual audio only via telephone due to patient not having video access. Done in the setting of Covid pandemic.   The patient was located at home. 2 patient identifiers used.  The provider was located at home.  The patient did consent to this visit and is aware of possible charges through their insurance for this visit.  The other persons participating in this telemedicine service were his wife.  Time spent on call was 30 minutes and in review of previous records >30 minutes total.  This virtual service is not related to other E/M service within previous 7 days.   Bernard Reed is a 75 y.o. male who is due for an annual wellness visit.  He has the following concerns: None    Immunization History  Administered Date(s) Administered   Fluad Quad(high Dose 65+) 02/25/2019   Influenza, High Dose Seasonal PF 03/23/2014, 04/08/2015, 03/01/2017, 03/04/2018   Influenza-Unspecified 03/28/2020   PFIZER(Purple Top)SARS-COV-2 Vaccination 08/31/2019, 09/29/2019, 03/28/2020   Pneumococcal Conjugate-13 03/23/2014   Pneumococcal Polysaccharide-23 05/10/2015   Tdap 03/23/2019   Zoster Recombinat (Shingrix) 09/25/2017   Last colonoscopy: due in 2023  Last PSA: was low last time about 6 months ago [facility]  Ophtho: Dr. Sabra Heck AAA screening done and negative  Exercise: daily exercise riding stationary bike.   Other doctors caring for patient include:  [facility] -Dr. Norina Buzzard  Neurologist- Dr. Idamae Lusher due to Parkinson's. Uses a walker.  Fell 3 weeks ago and no injury.  He has seen neuro rehab and works on balance   He also goes to the [place]    Depression screen:  See questionnaire below.     Depression screen [facility] 2/9 03/09/2021 01/26/2020 03/23/2019 12/30/2017 12/21/2016  Decreased Interest 0 0 0 0 0  Down, Depressed, Hopeless 0 0 0 0 0  PHQ - 2 Score 0 0 0 0 0    Fall Screen: See Questionaire below.   Fall Risk   03/09/2021 01/26/2021 09/19/2020 08/29/2020 08/03/2020  Falls in the past year? '1 1 1 1 1  '$ Comment - - - - -  Number falls in past yr: '1 1 1 1 1  '$ Comment most recent 3 weeks ago - - - -  Injury with Fall? 1 0 0 0 0  Comment bruised and scrapes - - - -  Risk for fall due to : History of fall(s) - - - -  Follow up Falls evaluation completed - - - -    ADL screen:  See questionnaire below.  Functional Status Survey: Is the patient deaf or have difficulty hearing?: No Does the patient have difficulty seeing, even when wearing glasses/contacts?: No Does the patient have difficulty concentrating, remembering, or making decisions?: No Does the patient have difficulty walking or climbing stairs?: Yes Does the patient have difficulty dressing or bathing?: No Does the patient have difficulty doing errands alone such as visiting a doctor's office or shopping?: Yes (always has someone with him)   End of Life Discussion:  Patient does not have a living will and medical power of attorney. Paperwork will be sent.    Review of Systems  Denies fever, chills, chest pain, palpitations, shortness of breath, abdominal pain, N/V/D.    PHYSICAL EXAM:  Ht '5\' 9"'$  (1.753 m)   Wt 180 lb (81.6 kg)   BMI 26.58 kg/m   Alert and oriented in no acute distress.  Speaking in complete sentences without difficulty.  Normal speech, mood,  memory and thought process.   ASSESSMENT/PLAN: Medicare annual wellness visit, subsequent  Multiple falls  Parkinson disease [facility])  Advance directive discussed with patient  Denies issues with depression. His wife helps him with ADLs as needed due to PD. He falls at times. Has been working with rehab and his neurologist is aware. On medication for PD.  Advance directive counseling done and documents will be mailed.  UTD with preventive healthcare. Follow up with PCP and specialists as recommended.   Discussed PSA screening (risks/benefits), recommended at least 30 minutes of  aerobic activity at least 5 days/week; proper sunscreen use reviewed; healthy diet and alcohol recommendations (less than or equal to 2 drinks/day) reviewed; regular seatbelt use; changing batteries in smoke detectors. Immunization recommendations discussed.  Colonoscopy recommendations reviewed.   Medicare Attestation I have personally reviewed: The patient's medical and social history Their use of alcohol, tobacco or illicit drugs Their current medications and supplements The patient's functional ability including ADLs,fall risks, home safety risks, cognitive, and hearing and visual impairment Diet and physical activities Evidence for depression or mood disorders  The patient's weight, height, and BMI have been recorded in the chart.  I have made referrals, counseling, and provided education to the patient based on review of the above and I have provided the patient with a written personalized care plan for preventive services.     Harland Dingwall, NP-C   03/09/2021

## 2021-03-09 ENCOUNTER — Other Ambulatory Visit: Payer: Self-pay

## 2021-03-09 ENCOUNTER — Telehealth (INDEPENDENT_AMBULATORY_CARE_PROVIDER_SITE_OTHER): Payer: PPO | Admitting: Family Medicine

## 2021-03-09 ENCOUNTER — Encounter: Payer: Self-pay | Admitting: Family Medicine

## 2021-03-09 VITALS — Ht 69.0 in | Wt 180.0 lb

## 2021-03-09 DIAGNOSIS — R296 Repeated falls: Secondary | ICD-10-CM | POA: Diagnosis not present

## 2021-03-09 DIAGNOSIS — G20 Parkinson's disease: Secondary | ICD-10-CM

## 2021-03-09 DIAGNOSIS — Z7189 Other specified counseling: Secondary | ICD-10-CM | POA: Diagnosis not present

## 2021-03-09 DIAGNOSIS — Z0000 Encounter for general adult medical examination without abnormal findings: Secondary | ICD-10-CM

## 2021-03-09 NOTE — Patient Instructions (Signed)
  Bernard Reed , Thank you for taking time to come for your Medicare Wellness Visit. I appreciate your ongoing commitment to your health goals. Please review the following plan we discussed and let me know if I can assist you in the future.    This is a list of the screening recommended for you and due dates:  Health Maintenance  Topic Date Due   Zoster (Shingles) Vaccine (2 of 2) 11/20/2017   COVID-19 Vaccine (4 - Booster for Pfizer series) 06/20/2020   Flu Shot  01/30/2021   Colon Cancer Screening  11/15/2021   Tetanus Vaccine  03/22/2029   Hepatitis C Screening: USPSTF Recommendation to screen - Ages 18-79 yo.  Completed   Pneumonia vaccines  Completed   HPV Vaccine  Aged Out

## 2021-03-14 ENCOUNTER — Ambulatory Visit: Payer: PPO | Admitting: Licensed Clinical Social Worker

## 2021-03-20 ENCOUNTER — Telehealth: Payer: Self-pay | Admitting: Neurology

## 2021-03-20 NOTE — Telephone Encounter (Signed)
Pt wife called in and would like to see about a sleeping aid to help him sleep. She has given him a few of hers and it seems to help some. Last week, he has started to have no balance, when he stands, he shakes, and cant walk. She wasn't sure if he needed to increase carbidopa, or something else. She would like a call back to discuss things

## 2021-03-20 NOTE — Telephone Encounter (Signed)
Called patients wife to let her know that Dr. Carles Collet will be back tomorrow.  I also asked her what sleeping medication she has been giving the patient she was not sure she didn't have the bottle and said she told the girl up front when leaving the message earlier. Patient has fallen 6 times and his tremors have gotten worse over the last week.

## 2021-03-21 NOTE — Telephone Encounter (Signed)
Patient wife called and would like to talk to someone about medical marijuana. They would like to know if that will help he and if so they would like to try it

## 2021-03-21 NOTE — Telephone Encounter (Signed)
Pt called and informed that pt needs to call his PCP for sleep aids, do not take his wife's meds, there is no change in his levodopa, and Dr Tat does not recommend he takes medical marijuana

## 2021-05-02 DEATH — deceased

## 2021-06-01 DEATH — deceased

## 2021-07-05 NOTE — Progress Notes (Deleted)
° °  Assessment/Plan:   1.  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  -EMG unrevealing.  -The right foot drop and right lower extremity paresthesias may be due to prior surgery  -Discussed end-of-life issues.    -Discussed the importance of advanced directives.    Subjective:   Bernard Reed was seen today in follow up for parkinsonism.  My previous records as well as any outside records available were reviewed prior to todays visit.  This patient is accompanied in the office by his spouse who supplements the history.  Increased his levodopa last visit, though I did tell them that levodopa may not be helpful in this atypical state.  Recommended strongly counseling for both patient and wife.  They met with my LCSW 1 time for that counseling in order to come back in about 3 weeks, but unfortunately did not follow-up after that.  Current movement disorder medications: Carbidopa/levodopa 25/100: 3/3/2 (increased last visit)   PREVIOUS MEDICATIONS: none to date  CURRENT MEDICATIONS:  Outpatient Encounter Medications as of 07/07/2021  Medication Sig   aspirin 81 MG tablet Take 81 mg by mouth daily.   carbidopa-levodopa (SINEMET IR) 25-100 MG tablet 3 at 7am/3 at 11am/2 at 4pm   diclofenac (VOLTAREN) 75 MG EC tablet Take 75 mg by mouth 2 (two) times daily. (Patient not taking: Reported on 03/09/2021)   hydrocortisone (ANUSOL-HC) 25 MG suppository Place 1 suppository (25 mg total) rectally at bedtime.   rosuvastatin (CRESTOR) 10 MG tablet Take 1 tablet (10 mg total) by mouth daily.   tamsulosin (FLOMAX) 0.4 MG CAPS capsule Take 1 capsule (0.4 mg total) by mouth daily.   No facility-administered encounter medications on file as of 07/07/2021.     Objective:   PHYSICAL EXAMINATION:    VITALS:   There were no vitals filed for this visit.    GEN:  The patient appears stated age and is in  NAD. HEENT:  Normocephalic, atraumatic.  The mucous membranes are moist. The superficial temporal arteries are without ropiness or tenderness. CV:  RRR Lungs:  CTAB Neck/HEME:  There are no carotid bruits bilaterally.  Neurological examination:  Orientation: The patient is alert and oriented x3. Cranial nerves: There is good facial symmetry.the extraocular muscles are intact.  The speech is fluent and clear.  There is no pseudobulbar speech.  Soft palate rises symmetrically and there is no tongue deviation. Hearing is intact to conversational tone. Sensation: Sensation is intact to light touch throughout Motor: Strength is at least antigravity x4.  Movement examination: Tone: There is mild increased tone in the RUE Abnormal movements: none (tremor felt and not seen) Coordination:  There is decremation on the right-hand side.  He has trouble with foot taps on the right (some of that is due to foot drop) Gait and Station: Patient pushes off of the chair to arise.  Walks fairly well with the walker.  Without the walker, he is very wide-based and somewhat unsteady.  Turns en bloc.   Total time spent on today's visit was *** minutes, including both face-to-face time and nonface-to-face time.  Time included that spent on review of records (prior notes available to me/labs/imaging if pertinent), discussing treatment and goals, answering patient's questions and coordinating care.  Cc:  Denita Lung, MD

## 2021-07-07 ENCOUNTER — Ambulatory Visit: Payer: PPO | Admitting: Neurology

## 2021-08-03 ENCOUNTER — Ambulatory Visit: Payer: PPO | Admitting: Neurology

## 2021-12-15 ENCOUNTER — Encounter: Payer: Self-pay | Admitting: Internal Medicine
